# Patient Record
Sex: Female | Born: 1950 | Race: White | Hispanic: No | Smoking: Never smoker
Health system: Southern US, Community
[De-identification: ages and names within clinical notes are randomized; demographics above are authoritative.]

## PROBLEM LIST (undated history)

## (undated) DIAGNOSIS — Z860100 Personal history of colon polyps, unspecified: Secondary | ICD-10-CM

## (undated) DIAGNOSIS — F411 Generalized anxiety disorder: Secondary | ICD-10-CM

## (undated) DIAGNOSIS — E079 Disorder of thyroid, unspecified: Secondary | ICD-10-CM

## (undated) DIAGNOSIS — I10 Essential (primary) hypertension: Secondary | ICD-10-CM

## (undated) DIAGNOSIS — R011 Cardiac murmur, unspecified: Secondary | ICD-10-CM

## (undated) DIAGNOSIS — F32A Depression, unspecified: Secondary | ICD-10-CM

## (undated) HISTORY — PX: CHOLECYSTECTOMY: SHX55

---

## 2013-02-13 DIAGNOSIS — F418 Other specified anxiety disorders: Secondary | ICD-10-CM | POA: Insufficient documentation

## 2020-02-20 DIAGNOSIS — I10 Essential (primary) hypertension: Secondary | ICD-10-CM | POA: Insufficient documentation

## 2020-08-17 DIAGNOSIS — R011 Cardiac murmur, unspecified: Secondary | ICD-10-CM | POA: Insufficient documentation

## 2020-08-17 DIAGNOSIS — K589 Irritable bowel syndrome without diarrhea: Secondary | ICD-10-CM | POA: Insufficient documentation

## 2021-01-28 ENCOUNTER — Emergency Department (INDEPENDENT_AMBULATORY_CARE_PROVIDER_SITE_OTHER): Payer: Medicare PPO

## 2021-01-28 ENCOUNTER — Emergency Department
Admission: EM | Admit: 2021-01-28 | Discharge: 2021-01-28 | Disposition: A | Discharge status: Home / Self Care | Payer: Medicare PPO | Source: Home / Self Care

## 2021-01-28 ENCOUNTER — Other Ambulatory Visit: Payer: Self-pay

## 2021-01-28 ENCOUNTER — Encounter: Payer: Self-pay | Admitting: Emergency Medicine

## 2021-01-28 DIAGNOSIS — R509 Fever, unspecified: Secondary | ICD-10-CM | POA: Diagnosis not present

## 2021-01-28 DIAGNOSIS — R059 Cough, unspecified: Secondary | ICD-10-CM

## 2021-01-28 DIAGNOSIS — J209 Acute bronchitis, unspecified: Secondary | ICD-10-CM | POA: Diagnosis not present

## 2021-01-28 HISTORY — DX: Cardiac murmur, unspecified: R01.1

## 2021-01-28 HISTORY — DX: Personal history of colon polyps, unspecified: Z86.0100

## 2021-01-28 HISTORY — DX: Generalized anxiety disorder: F41.1

## 2021-01-28 HISTORY — DX: Disorder of thyroid, unspecified: E07.9

## 2021-01-28 HISTORY — DX: Essential (primary) hypertension: I10

## 2021-01-28 HISTORY — DX: Personal history of colonic polyps: Z86.010

## 2021-01-28 HISTORY — DX: Depression, unspecified: F32.A

## 2021-01-28 MED ORDER — BENZONATATE 100 MG PO CAPS: 100.0000 mg | ORAL_CAPSULE | Freq: Three times a day (TID) | ORAL | 0 refills | Status: DC

## 2021-01-28 NOTE — ED Triage Notes (Signed)
Cough & low grade fever since last Saturday after eating at a restaurant Ibuprofen for fever - Tmax 100 No OTC  cold meds Pt concerned about cough   COVID vaccine - no booster  Home test negative on Wed

## 2021-01-28 NOTE — Discharge Instructions (Addendum)
No pneumonia on x-ray. Take cough medication as prescribed. Can also continue OTC medicine. Rest, and keep hydrated. Follow-up with PCP if minimal improvement in a week. Go to the ER if develop difficulty breathing.

## 2021-01-28 NOTE — ED Provider Notes (Signed)
[facility]    CSN: [id number] Arrival date & time: 01/28/21  1033      History   Chief Complaint Chief Complaint  Patient presents with  . Cough    HPI Brandy Huffman is a 70 y.o. female.   Patient presents with cough, low grade fever up to 100F, and feeling unwell since Saturday evening. She reports she went out to eat and it was very cold and when she got home she started coughing. The cough has continued since then, worse at night. She states she feels like she needs to cough something up but can't. Her throat has been bothering her from all the coughing and she has had some mild nasal congestion. The patient denies difficulty breathing, chest pain, or history of asthma or lung problems such as COPD. She has tried ibuprofen with some improvement in her temperature. She took a home COVID test Wednesday that was negative. The patient denies known sick contacts.  The history is provided by the patient.  Cough Cough characteristics:  Non-productive Associated symptoms: fever and sore throat   Associated symptoms: no chest pain, no headaches, no myalgias, no rash, no rhinorrhea, no shortness of breath and no wheezing    Past Medical History:  Diagnosis Date  . Depression   . GAD (generalized anxiety disorder)   . Heart murmur   . History of colon polyps   . Hypertension   . Thyroid disease     Patient Active Problem List   Diagnosis Date Noted  . Heart murmur 08/17/2020  . IBS (irritable bowel syndrome) 08/17/2020  . Essential hypertension 02/20/2020  . Depression with anxiety 02/13/2013    Past Surgical History:  Procedure Laterality Date  . CHOLECYSTECTOMY      OB History   No obstetric history on file.      Home Medications    Prior to Admission medications   Medication Sig Start Date End Date Taking? Authorizing Provider  alendronate (FOSAMAX) 70 MG tablet Take 1 tablet in the morning weekly with a full glass of water on empty stomach 03/12/20  Yes  [provider]  atorvastatin (LIPITOR) 10 MG tablet Take 1 tablet by mouth daily. 09/01/13  Yes [provider]  benzonatate (TESSALON) 100 MG capsule Take 1 capsule (100 mg total) by mouth every 8 (eight) hours. 01/28/21  Yes Kayleena Eke L, PA  buPROPion (WELLBUTRIN XL) 150 MG 24 hr tablet Take by mouth. 01/26/17  Yes [provider]  losartan (COZAAR) 25 MG tablet TAKE 1/2 TABLET (12.5MG ) BY MOUTH DAILY 05/25/20  Yes [provider]  brimonidine (ALPHAGAN) 0.2 % ophthalmic solution 1 drop 2 (two) times daily. 08/09/20   [provider]  Calcium Carbonate-Vitamin D 600-200 MG-UNIT TABS Take 1 tablet by mouth daily.    [provider]  dorzolamide-timolol (COSOPT) 22.3-6.8 MG/ML ophthalmic solution Place 1 drop into both eyes 2 times daily.    [provider]  escitalopram (LEXAPRO) 10 MG tablet Take by mouth.    [provider]  Multiple Vitamin (MULTI-VITAMIN) tablet Take 1 tablet by mouth daily.    [provider]  Omega-3 Fatty Acids (FISH OIL) 1000 MG CAPS 1 capsule daily after lunch.    [provider]  vitamin B-12 (CYANOCOBALAMIN) 250 MCG tablet Take by mouth.    [provider]    Family History Family History  Problem Relation Age of Onset  . Dementia Mother   . Hypertension Father   . Cancer Father   .  Mesothelioma Father     Social History Social History   Tobacco Use  . Smoking status: Never    Passive exposure: Never  . Smokeless tobacco: Never  Vaping Use  . Vaping Use: Never used  Substance Use Topics  . Alcohol use: Not Currently  . Drug use: Never     Allergies   Risedronate   Review of Systems Review of Systems  Constitutional:  Positive for fatigue and fever.  HENT:  Positive for congestion and sore throat. Negative for rhinorrhea.   Respiratory:  Positive for cough. Negative for chest tightness, shortness of breath and wheezing.   Cardiovascular:   Negative for chest pain.  Gastrointestinal:  Negative for diarrhea, nausea and vomiting.  Musculoskeletal:  Negative for myalgias.  Skin:  Negative for rash.  Neurological:  Negative for dizziness, weakness and headaches.    Physical Exam Triage Vital Signs ED Triage Vitals  Enc Vitals Group     BP 01/28/21 1051 135/85     Pulse Rate 01/28/21 1051 (!) 104     Resp 01/28/21 1051 18     Temp 01/28/21 1051 99.5 F (37.5 C)     Temp Source 01/28/21 1051 Oral     SpO2 01/28/21 1051 98 %     Weight 01/28/21 1052 139 lb (63 kg)     Height 01/28/21 1052 5\' 7"  (1.702 m)     Head Circumference --      Peak Flow --      Pain Score 01/28/21 1052 0     Pain Loc --      Pain Edu? --      Excl. in GC? --    No data found.  Updated Vital Signs BP 135/85 (BP Location: Left Arm)   Pulse (!) 104   Temp 99.5 F (37.5 C) (Oral)   Resp 18   Ht 5\' 7"  (1.702 m)   Wt 139 lb (63 kg)   SpO2 98%   BMI 21.77 kg/m   Visual Acuity Right Eye Distance:   Left Eye Distance:   Bilateral Distance:    Right Eye Near:   Left Eye Near:    Bilateral Near:     Physical Exam Vitals and nursing note reviewed.  Constitutional:      General: She is not in acute distress. HENT:     Head: Normocephalic.     Nose: Congestion present.     Mouth/Throat:     Mouth: Mucous membranes are moist.     Pharynx: Oropharynx is clear. No oropharyngeal exudate or posterior oropharyngeal erythema.  Eyes:     Conjunctiva/sclera: Conjunctivae normal.     Pupils: Pupils are equal, round, and reactive to light.  Cardiovascular:     Rate and Rhythm: Normal rate and regular rhythm.     Heart sounds: Normal heart sounds.  Pulmonary:     Effort: Pulmonary effort is normal. No respiratory distress.     Breath sounds: No wheezing, rhonchi or rales.  Musculoskeletal:     Cervical back: Normal range of motion.  Lymphadenopathy:     Cervical: No cervical adenopathy.  Skin:    General: Skin is warm.     Findings: No  rash.  Neurological:     Mental Status: She is alert.  Psychiatric:        Mood and Affect: Mood normal.     UC Treatments / Results  Labs (all labs ordered are listed, but only abnormal results are displayed) Labs Reviewed -  No data to display  EKG   Radiology DG Chest 2 View  Result Date: 01/28/2021 CLINICAL DATA:  Cough, fever x6d. Additional history provided: Cough, low-grade fever, symptoms for 1 week. Recent negative COVID test at home. EXAM: CHEST - 2 VIEW COMPARISON:  Chest CT 09/19/2017 (images available, report unavailable). Chest radiographs 09/17/2017. FINDINGS: Heart size within normal limits. Aortic atherosclerosis. Biapical pleuroparenchymal scarring. No appreciable airspace consolidation. No evidence of pleural effusion or pneumothorax. No acute bony abnormality identified. Thoracic dextrocurvature. Surgical clips within the right upper quadrant of the abdomen. IMPRESSION: No evidence of acute cardiopulmonary abnormality. Aortic Atherosclerosis (ICD10-I70.0). Electronically Signed   By: Jackey Loge DO   On: 01/28/2021 11:45    Procedures Procedures (including critical care time)  Medications Ordered in UC Medications - No data to display  Initial Impression / Assessment and Plan / UC Course  I have reviewed the triage vital signs and the nursing notes.  Pertinent labs & imaging results that were available during my care of the patient were reviewed by me and considered in my medical decision making (see chart for details).     Neg CXR. Consistent with viral bronchitis. Sx tx w Tessalon, ibuprofen. Reassurance and discussed ER precautions.   E/M: 1 acute illness with systemic symptoms, 1 data (CXR), moderate risk due to prescription management  Final Clinical Impressions(s) / UC Diagnoses   Final diagnoses:  Acute bronchitis, unspecified organism     Discharge Instructions      No pneumonia on x-ray. Take cough medication as prescribed. Can also  continue OTC medicine. Rest, and keep hydrated. Follow-up with PCP if minimal improvement in a week. Go to the ER if develop difficulty breathing.      ED Prescriptions     Medication Sig Dispense Auth. Provider   benzonatate (TESSALON) 100 MG capsule Take 1 capsule (100 mg total) by mouth every 8 (eight) hours. 21 capsule Vallery Sa, Zane Pellecchia L, PA      PDMP not reviewed this encounter.   Estanislado Pandy, [place] 01/28/21 1159

## 2021-07-04 ENCOUNTER — Emergency Department
Admission: EM | Admit: 2021-07-04 | Discharge: 2021-07-04 | Disposition: A | Discharge status: Home / Self Care | Payer: Medicare PPO | Source: Home / Self Care

## 2021-07-04 ENCOUNTER — Other Ambulatory Visit: Payer: Self-pay

## 2021-07-04 DIAGNOSIS — R059 Cough, unspecified: Secondary | ICD-10-CM

## 2021-07-04 MED ORDER — BENZONATATE 200 MG PO CAPS: 200.0000 mg | ORAL_CAPSULE | Freq: Three times a day (TID) | ORAL | 0 refills | Status: AC | PRN

## 2021-07-04 MED ORDER — AZITHROMYCIN 250 MG PO TABS: 250.0000 mg | ORAL_TABLET | Freq: Every day | ORAL | 0 refills | Status: AC

## 2021-07-04 NOTE — ED Provider Notes (Signed)
[facility]    CSN: [id number] Arrival date & time: 07/04/21  1128      History   Chief Complaint Chief Complaint  Patient presents with   Nasal Congestion   Cough    HPI Brandy Huffman is a 70 y.o. female.   HPI 70 year old female presents with cough, nasal drainage and loss of voice for 3 days.  PMH significant for HTN, heart murmur, and thyroid disease.   Past Medical History:  Diagnosis Date   Depression    GAD (generalized anxiety disorder)    Heart murmur    History of colon polyps    Hypertension    Thyroid disease     Patient Active Problem List   Diagnosis Date Noted   Heart murmur 08/17/2020   IBS (irritable bowel syndrome) 08/17/2020   Essential hypertension 02/20/2020   Depression with anxiety 02/13/2013    Past Surgical History:  Procedure Laterality Date   CHOLECYSTECTOMY      OB History   No obstetric history on file.      Home Medications    Prior to Admission medications   Medication Sig Start Date End Date Taking? Authorizing Provider  azithromycin (ZITHROMAX) 250 MG tablet Take 1 tablet (250 mg total) by mouth daily. Take first 2 tablets together, then 1 every day until finished. 07/04/21  Yes Trevor Iha, FNP  benzonatate (TESSALON) 200 MG capsule Take 1 capsule (200 mg total) by mouth 3 (three) times daily as needed for up to 7 days for cough. 07/04/21 07/11/21 Yes Trevor Iha, FNP  alendronate (FOSAMAX) 70 MG tablet Take 1 tablet in the morning weekly with a full glass of water on empty stomach 03/12/20   [provider]  atorvastatin (LIPITOR) 10 MG tablet Take 1 tablet by mouth daily. 09/01/13   [provider]  brimonidine (ALPHAGAN) 0.2 % ophthalmic solution 1 drop 2 (two) times daily. 08/09/20   [provider]  buPROPion (WELLBUTRIN XL) 150 MG 24 hr tablet Take by mouth. 01/26/17   [provider]  Calcium Carbonate-Vitamin D 600-200 MG-UNIT TABS Take 1 tablet by mouth daily.     [provider]  dorzolamide-timolol (COSOPT) 22.3-6.8 MG/ML ophthalmic solution Place 1 drop into both eyes 2 times daily.    [provider]  escitalopram (LEXAPRO) 10 MG tablet Take by mouth.    [provider]  losartan (COZAAR) 25 MG tablet TAKE 1/2 TABLET (12.5MG ) BY MOUTH DAILY 05/25/20   [provider]  Multiple Vitamin (MULTI-VITAMIN) tablet Take 1 tablet by mouth daily.    [provider]  Omega-3 Fatty Acids (FISH OIL) 1000 MG CAPS 1 capsule daily after lunch.    [provider]  vitamin B-12 (CYANOCOBALAMIN) 250 MCG tablet Take by mouth.    [provider]    Family History Family History  Problem Relation Age of Onset   Dementia Mother    Hypertension Father    Cancer Father    Mesothelioma Father     Social History Social History   Tobacco Use   Smoking status: Never    Passive exposure: Never   Smokeless tobacco: Never  Vaping Use   Vaping Use: Never used  Substance Use Topics   Alcohol use: Not Currently   Drug use: Never     Allergies   Risedronate   Review of Systems Review of Systems  HENT:  Positive for congestion and voice change.   Respiratory:  Positive for cough.   All other  systems reviewed and are negative.   Physical Exam Triage Vital Signs ED Triage Vitals  Enc Vitals Group     BP 07/04/21 1154 (!) 153/95     Pulse Rate 07/04/21 1154 (!) 109     Resp 07/04/21 1154 14     Temp 07/04/21 1154 99.2 F (37.3 C)     Temp Source 07/04/21 1154 Oral     SpO2 07/04/21 1154 99 %     Weight --      Height --      Head Circumference --      Peak Flow --      Pain Score 07/04/21 1156 0     Pain Loc --      Pain Edu? --      Excl. in GC? --    No data found.  Updated Vital Signs BP (!) 153/95 (BP Location: Left Arm)    Pulse (!) 109    Temp 99.2 F (37.3 C) (Oral)    Resp 14    SpO2 99%   Physical Exam Vitals and nursing note reviewed.  Constitutional:      Appearance:  Normal appearance. She is normal weight.  HENT:     Head: Normocephalic and atraumatic.     Right Ear: Tympanic membrane, ear canal and external ear normal.     Left Ear: Tympanic membrane, ear canal and external ear normal.     Mouth/Throat:     Mouth: Mucous membranes are moist.     Pharynx: Oropharynx is clear.  Eyes:     Extraocular Movements: Extraocular movements intact.     Conjunctiva/sclera: Conjunctivae normal.     Pupils: Pupils are equal, round, and reactive to light.  Cardiovascular:     Rate and Rhythm: Normal rate and regular rhythm.     Pulses: Normal pulses.     Heart sounds: Normal heart sounds.  Pulmonary:     Effort: Pulmonary effort is normal.     Breath sounds: Normal breath sounds. No wheezing, rhonchi or rales.  Musculoskeletal:     Cervical back: Normal range of motion and neck supple.  Skin:    General: Skin is warm and dry.  Neurological:     General: No focal deficit present.     Mental Status: She is alert and oriented to person, place, and time.     UC Treatments / Results  Labs (all labs ordered are listed, but only abnormal results are displayed) Labs Reviewed  COVID-19, FLU A+B NAA    EKG   Radiology No results found.  Procedures Procedures (including critical care time)  Medications Ordered in UC Medications - No data to display  Initial Impression / Assessment and Plan / UC Course  I have reviewed the triage vital signs and the nursing notes.  Pertinent labs & imaging results that were available during my care of the patient were reviewed by me and considered in my medical decision making (see chart for details).     MDM: 1. Cough-COVID-19 flu A/B ordered, Rx Tessalon Perles and Zithromax. Advised patient we will follow-up with COVID-19 flu A/B results once returned.  Advised patient may use Tessalon Perles daily or as needed for cough.  Advised patient if cough and/or sore throat worsens may start Zithromax on either Thursday,  07/07/2021 or Friday, 07/08/2021.  Advised once starting antibiotic please take to completion.  Encouraged patient to increase daily water intake while taking these medications.  Patient discharged home, hemodynamically stable Final Clinical  Impressions(s) / UC Diagnoses   Final diagnoses:  Cough, unspecified type     Discharge Instructions      Advised patient we will follow-up with COVID-19 flu A/B results once returned.  Advised patient may use Tessalon Perles daily or as needed for cough.  Advised patient if cough and/or sore throat worsens may start Zithromax on either Thursday, 07/07/2021 or Friday, 07/08/2021.  Advised once starting antibiotic please take to completion.  Encouraged patient to increase daily water intake while taking these medications.     ED Prescriptions     Medication Sig Dispense Auth. Provider   benzonatate (TESSALON) 200 MG capsule Take 1 capsule (200 mg total) by mouth 3 (three) times daily as needed for up to 7 days for cough. 40 capsule Trevor Iha, FNP   azithromycin (ZITHROMAX) 250 MG tablet Take 1 tablet (250 mg total) by mouth daily. Take first 2 tablets together, then 1 every day until finished. 6 tablet Trevor Iha, FNP      PDMP not reviewed this encounter.   Trevor Iha, FNP 07/04/21 1429

## 2021-07-04 NOTE — Discharge Instructions (Addendum)
Advised patient we will follow-up with COVID-19 flu A/B results once returned.  Advised patient may use Tessalon Perles daily or as needed for cough.  Advised patient if cough and/or sore throat worsens may start Zithromax on either Thursday, 07/07/2021 or Friday, 07/08/2021.  Advised once starting antibiotic please take to completion.  Encouraged patient to increase daily water intake while taking these medications.

## 2021-07-04 NOTE — ED Triage Notes (Signed)
Pt presents with cough, nasal drainage, loss of voice that began Friday.

## 2021-07-05 LAB — COVID-19, FLU A+B NAA
Influenza A, NAA: NOT DETECTED
Influenza B, NAA: NOT DETECTED
SARS-CoV-2, NAA: NOT DETECTED

## 2021-12-29 ENCOUNTER — Emergency Department
Admission: EM | Admit: 2021-12-29 | Discharge: 2021-12-29 | Disposition: A | Discharge status: Home / Self Care | Payer: Medicare PPO | Source: Home / Self Care

## 2021-12-29 ENCOUNTER — Encounter: Payer: Self-pay | Admitting: Emergency Medicine

## 2021-12-29 DIAGNOSIS — L237 Allergic contact dermatitis due to plants, except food: Secondary | ICD-10-CM | POA: Diagnosis not present

## 2021-12-29 MED ORDER — PREDNISONE 10 MG (21) PO TBPK: ORAL_TABLET | Freq: Every day | ORAL | 0 refills | Status: AC

## 2021-12-29 MED ORDER — METHYLPREDNISOLONE ACETATE 80 MG/ML IJ SUSP
80.0000 mg | Freq: Once | INTRAMUSCULAR | Status: AC
  Administered 2021-12-29: 80 mg via INTRAMUSCULAR

## 2021-12-29 NOTE — ED Provider Notes (Signed)
[facility]    CSN: [id number] Arrival date & time: 12/29/21  1406      History   Chief Complaint Chief Complaint  Patient presents with   Rash    HPI Brandy Huffman is a 71 y.o. female.   HPI 71 year old female presents with possible poison ivy dermatitis over her arms and chest from working in the yard for the time past 3 days.  PMH significant for HTN and thyroid disease.  Past Medical History:  Diagnosis Date   Depression    GAD (generalized anxiety disorder)    Heart murmur    History of colon polyps    Hypertension    Thyroid disease     Patient Active Problem List   Diagnosis Date Noted   Heart murmur 08/17/2020   IBS (irritable bowel syndrome) 08/17/2020   Essential hypertension 02/20/2020   Depression with anxiety 02/13/2013    Past Surgical History:  Procedure Laterality Date   CHOLECYSTECTOMY      OB History   No obstetric history on file.      Home Medications    Prior to Admission medications   Medication Sig Start Date End Date Taking? Authorizing Provider  atorvastatin (LIPITOR) 10 MG tablet Take 1 tablet by mouth daily. 09/01/13  Yes [provider]  azithromycin (ZITHROMAX) 250 MG tablet Take 1 tablet (250 mg total) by mouth daily. Take first 2 tablets together, then 1 every day until finished. 07/04/21  Yes Trevor Iha, FNP  brimonidine (ALPHAGAN) 0.2 % ophthalmic solution 1 drop 2 (two) times daily. 08/09/20  Yes [provider]  buPROPion (WELLBUTRIN XL) 150 MG 24 hr tablet Take by mouth. 01/26/17  Yes [provider]  Calcium Carbonate-Vitamin D 600-200 MG-UNIT TABS Take 1 tablet by mouth daily.   Yes [provider]  dorzolamide-timolol (COSOPT) 22.3-6.8 MG/ML ophthalmic solution Place 1 drop into both eyes 2 times daily.   Yes [provider]  escitalopram (LEXAPRO) 10 MG tablet Take by mouth.   Yes [provider]  losartan (COZAAR) 25 MG tablet TAKE 1/2 TABLET (12.5MG ) BY  MOUTH DAILY 05/25/20  Yes [provider]  Multiple Vitamin (MULTI-VITAMIN) tablet Take 1 tablet by mouth daily.   Yes [provider]  Omega-3 Fatty Acids (FISH OIL) 1000 MG CAPS 1 capsule daily after lunch.   Yes [provider]  predniSONE (STERAPRED UNI-PAK 21 TAB) 10 MG (21) TBPK tablet Take by mouth daily. Take 6 tabs by mouth daily  for 2 days, then 5 tabs for 2 days, then 4 tabs for 2 days, then 3 tabs for 2 days, 2 tabs for 2 days, then 1 tab by mouth daily for 2 days 12/29/21  Yes Trevor Iha, FNP  vitamin B-12 (CYANOCOBALAMIN) 250 MCG tablet Take by mouth.   Yes [provider]  alendronate (FOSAMAX) 70 MG tablet Take 1 tablet in the morning weekly with a full glass of water on empty stomach 03/12/20   [provider]    Family History Family History  Problem Relation Age of Onset   Dementia Mother    Hypertension Father    Cancer Father    Mesothelioma Father     Social History Social History   Tobacco Use   Smoking status: Never    Passive exposure: Never   Smokeless tobacco: Never  Vaping Use   Vaping Use: Never used  Substance Use Topics   Alcohol use: Not Currently   Drug use: Never  Allergies   Risedronate   Review of Systems Review of Systems  Skin:  Positive for rash.  All other systems reviewed and are negative.    Physical Exam Triage Vital Signs ED Triage Vitals  Enc Vitals Group     BP 12/29/21 1425 135/87     Pulse Rate 12/29/21 1425 73     Resp 12/29/21 1425 18     Temp 12/29/21 1425 98.6 F (37 C)     Temp Source 12/29/21 1425 Oral     SpO2 12/29/21 1425 (!) 73 %     Weight 12/29/21 1426 131 lb (59.4 kg)     Height 12/29/21 1426 5\' 7"  (1.702 m)     Head Circumference --      Peak Flow --      Pain Score 12/29/21 1426 0     Pain Loc --      Pain Edu? --      Excl. in GC? --    No data found.  Updated Vital Signs BP 135/87 (BP Location: Right Arm)   Pulse 73   Temp 98.6 F (37  C) (Oral)   Resp 18   Ht 5\' 7"  (1.702 m)   Wt 131 lb (59.4 kg)   SpO2 (!) 73%   BMI 20.52 kg/m      Physical Exam Vitals and nursing note reviewed.  Constitutional:      General: She is not in acute distress.    Appearance: Normal appearance. She is normal weight. She is not ill-appearing.  HENT:     Head: Normocephalic and atraumatic.     Mouth/Throat:     Mouth: Mucous membranes are moist.     Pharynx: Oropharynx is clear.  Eyes:     Extraocular Movements: Extraocular movements intact.     Conjunctiva/sclera: Conjunctivae normal.     Pupils: Pupils are equal, round, and reactive to light.  Cardiovascular:     Rate and Rhythm: Normal rate and regular rhythm.     Pulses: Normal pulses.     Heart sounds: Normal heart sounds.  Pulmonary:     Effort: Pulmonary effort is normal.     Breath sounds: Normal breath sounds. No wheezing, rhonchi or rales.  Musculoskeletal:     Cervical back: Normal range of motion and neck supple.  Skin:    General: Skin is warm and dry.     Comments: Right anterior shoulder, inferior neck, bilateral lower arms: Pruritic erythematous maculopapular eruption with grouped linear vesicles noted  Neurological:     General: No focal deficit present.     Mental Status: She is alert and oriented to person, place, and time.      UC Treatments / Results  Labs (all labs ordered are listed, but only abnormal results are displayed) Labs Reviewed - No data to display  EKG   Radiology No results found.  Procedures Procedures (including critical care time)  Medications Ordered in UC Medications  methylPREDNISolone acetate (DEPO-MEDROL) injection 80 mg (has no administration in time range)    Initial Impression / Assessment and Plan / UC Course  I have reviewed the triage vital signs and the nursing notes.  Pertinent labs & imaging results that were available during my care of the patient were reviewed by me and considered in my medical  decision making (see chart for details).     MDM: 1.  Poison ivy dermatitis-IM Depo-Medrol 80 mg given once in clinic prior to discharge, Rx'd Sterapred Unipak. Instructed patient  to take medication as directed with food to completion.  Encouraged patient to increase daily water intake while taking this medication.  Encouraged patient to change bed linens for the next 3 days to avoid recontamination.  Advised patient if symptoms worsen and/or unresolved please follow-up with PCP or here for further evaluation.  Patient discharged home, hemodynamically stable. Final Clinical Impressions(s) / UC Diagnoses   Final diagnoses:  Poison ivy dermatitis     Discharge Instructions      Instructed patient to take medication as directed with food to completion.  Encouraged patient to increase daily water intake while taking this medication.  Encouraged patient to change bed linens for the next 3 days to avoid recontamination.  Advised patient if symptoms worsen and/or unresolved please follow-up with PCP or here for further evaluation.     ED Prescriptions     Medication Sig Dispense Auth. Provider   predniSONE (STERAPRED UNI-PAK 21 TAB) 10 MG (21) TBPK tablet Take by mouth daily. Take 6 tabs by mouth daily  for 2 days, then 5 tabs for 2 days, then 4 tabs for 2 days, then 3 tabs for 2 days, 2 tabs for 2 days, then 1 tab by mouth daily for 2 days 42 tablet Trevor Iha, FNP      PDMP not reviewed this encounter.   Trevor Iha, FNP 12/29/21 1520

## 2021-12-29 NOTE — ED Triage Notes (Signed)
Patient states that she believes she has poison ivy on her arms and chest from working out in the yards x 3 days.  Patient has applied the alcohol to the areas, itching and redness.

## 2021-12-29 NOTE — Discharge Instructions (Addendum)
Instructed patient to take medication as directed with food to completion.  Encouraged patient to increase daily water intake while taking this medication.  Encouraged patient to change bed linens for the next 3 days to avoid recontamination.  Advised patient if symptoms worsen and/or unresolved please follow-up with PCP or here for further evaluation.

## 2022-05-04 ENCOUNTER — Ambulatory Visit
Admission: EM | Admit: 2022-05-04 | Discharge: 2022-05-04 | Disposition: A | Discharge status: Home / Self Care | Payer: Medicare PPO

## 2022-05-04 DIAGNOSIS — H6123 Impacted cerumen, bilateral: Secondary | ICD-10-CM | POA: Diagnosis not present

## 2022-05-04 NOTE — ED Triage Notes (Signed)
Pt c/o bilateral hearing loss x 1 year. Worsening in last week. PCP couldn't get her in until December. No hx of ear wax impactions. Tried to flush with peroxide last night with no relief.

## 2022-05-04 NOTE — ED Provider Notes (Signed)
[facility]    CSN: [id number] Arrival date & time: 05/04/22  0948      History   Chief Complaint Chief Complaint  Patient presents with   Ear Fullness    Bilateral    HPI Brandy Huffman is a 71 y.o. female.   HPI 71 year old female presents with bilateral hearing loss for the past year.  Patient reports hearing loss is worsened over the past week.  Reports that PT PCP could not get her in until December.  Patient denies history of earwax impactions.  Patient reports tried to flush with peroxide last night with no relief.  PMH significant for thyroid disease, HTN, and GAD.  Past Medical History:  Diagnosis Date   Depression    GAD (generalized anxiety disorder)    Heart murmur    History of colon polyps    Hypertension    Thyroid disease     Patient Active Problem List   Diagnosis Date Noted   Heart murmur 08/17/2020   IBS (irritable bowel syndrome) 08/17/2020   Essential hypertension 02/20/2020   Depression with anxiety 02/13/2013    Past Surgical History:  Procedure Laterality Date   CHOLECYSTECTOMY      OB History   No obstetric history on file.      Home Medications    Prior to Admission medications   Medication Sig Start Date End Date Taking? Authorizing Provider  alendronate (FOSAMAX) 70 MG tablet Take 1 tablet in the morning weekly with a full glass of water on empty stomach 03/12/20   [provider]  atorvastatin (LIPITOR) 10 MG tablet Take 1 tablet by mouth daily. 09/01/13   [provider]  azithromycin (ZITHROMAX) 250 MG tablet Take 1 tablet (250 mg total) by mouth daily. Take first 2 tablets together, then 1 every day until finished. 07/04/21   Trevor Iha, FNP  brimonidine (ALPHAGAN) 0.2 % ophthalmic solution 1 drop 2 (two) times daily. 08/09/20   [provider]  buPROPion (WELLBUTRIN XL) 150 MG 24 hr tablet Take by mouth. 01/26/17   [provider]  Calcium Carbonate-Vitamin D 600-200 MG-UNIT TABS  Take 1 tablet by mouth daily.    [provider]  dorzolamide-timolol (COSOPT) 22.3-6.8 MG/ML ophthalmic solution Place 1 drop into both eyes 2 times daily.    [provider]  escitalopram (LEXAPRO) 10 MG tablet Take by mouth.    [provider]  losartan (COZAAR) 25 MG tablet TAKE 1/2 TABLET (12.5MG ) BY MOUTH DAILY 05/25/20   [provider]  Multiple Vitamin (MULTI-VITAMIN) tablet Take 1 tablet by mouth daily.    [provider]  Omega-3 Fatty Acids (FISH OIL) 1000 MG CAPS 1 capsule daily after lunch.    [provider]  predniSONE (STERAPRED UNI-PAK 21 TAB) 10 MG (21) TBPK tablet Take by mouth daily. Take 6 tabs by mouth daily  for 2 days, then 5 tabs for 2 days, then 4 tabs for 2 days, then 3 tabs for 2 days, 2 tabs for 2 days, then 1 tab by mouth daily for 2 days 12/29/21   Trevor Iha, FNP  vitamin B-12 (CYANOCOBALAMIN) 250 MCG tablet Take by mouth.    [provider]    Family History Family History  Problem Relation Age of Onset   Dementia Mother    Hypertension Father    Cancer Father    Mesothelioma Father     Social History Social History   Tobacco Use   Smoking status: Never  Passive exposure: Never   Smokeless tobacco: Never  Vaping Use   Vaping Use: Never used  Substance Use Topics   Alcohol use: Not Currently   Drug use: Never     Allergies   Risedronate   Review of Systems Review of Systems  HENT:         Bilateral hearing loss x1 year     Physical Exam Triage Vital Signs ED Triage Vitals  Enc Vitals Group     BP 05/04/22 0959 136/81     Pulse Rate 05/04/22 0959 (!) 101     Resp 05/04/22 0959 18     Temp 05/04/22 0959 98.2 F (36.8 C)     Temp Source 05/04/22 0959 Oral     SpO2 05/04/22 0959 100 %     Weight --      Height --      Head Circumference --      Peak Flow --      Pain Score 05/04/22 1000 1     Pain Loc --      Pain Edu? --      Excl. in [place]? --    No data  found.  Updated Vital Signs BP 136/81 (BP Location: Left Arm)   Pulse (!) 101   Temp 98.2 F (36.8 C) (Oral)   Resp 18   SpO2 100%    Physical Exam Vitals and nursing note reviewed.  Constitutional:      Appearance: Normal appearance. She is normal weight.  HENT:     Head: Normocephalic and atraumatic.     Ears:     Comments: EAC's occluded with cerumen unable to visualize either TM; post bilateral ear lavage EAC's clear with TM's retracted, cloudy with dull light reflex noted bilaterally    Mouth/Throat:     Mouth: Mucous membranes are moist.     Pharynx: Oropharynx is clear.  Eyes:     Extraocular Movements: Extraocular movements intact.     Conjunctiva/sclera: Conjunctivae normal.     Pupils: Pupils are equal, round, and reactive to light.  Cardiovascular:     Rate and Rhythm: Normal rate and regular rhythm.     Pulses: Normal pulses.     Heart sounds: Normal heart sounds.  Pulmonary:     Effort: Pulmonary effort is normal.     Breath sounds: Normal breath sounds. No wheezing, rhonchi or rales.  Musculoskeletal:        General: Normal range of motion.     Cervical back: Normal range of motion and neck supple.  Skin:    General: Skin is warm and dry.  Neurological:     General: No focal deficit present.     Mental Status: She is alert and oriented to person, place, and time.      UC Treatments / Results  Labs (all labs ordered are listed, but only abnormal results are displayed) Labs Reviewed - No data to display  EKG   Radiology No results found.  Procedures Procedures (including critical care time)  Medications Ordered in UC Medications - No data to display  Initial Impression / Assessment and Plan / UC Course  I have reviewed the triage vital signs and the nursing notes.  Pertinent labs & imaging results that were available during my care of the patient were reviewed by me and considered in my medical decision making (see chart for details).      MDM: 1.  Bilateral impacted cerumen-resolved with bilateral ear lavage; hearing loss  of both ears due to cerumen impaction-Advised patient bilateral ear lavage resolved occluding earwax.  Advised patient to follow-up with PCP for referral to audiology for further evaluation of continued hearing loss.  Patient discharged home, hemodynamically stable. Final Clinical Impressions(s) / UC Diagnoses   Final diagnoses:  Hearing loss of both ears due to cerumen impaction  Bilateral impacted cerumen     Discharge Instructions      Advised patient bilateral ear lavage resolved occluding earwax.  Advised patient to follow-up with PCP for referral to audiology for further evaluation of continued hearing loss.     ED Prescriptions   None    PDMP not reviewed this encounter.   Trevor Iha, FNP 05/04/22 1030

## 2022-05-04 NOTE — Discharge Instructions (Addendum)
Advised patient bilateral ear lavage resolved occluding earwax.  Advised patient to follow-up with PCP for referral to audiology for further evaluation of continued hearing loss.

## 2022-12-07 IMAGING — DX DG CHEST 2V
2 series · 2 of 2 positions shown · non-contrast
Comparison: Chest CT 09/19/2017 (images available, report
unavailable). Chest radiographs 09/17/2017.

CLINICAL DATA: Cough, fever x6d. Additional history provided:
Cough, low-grade fever, symptoms for 1 week. Recent negative COVID
test at home.

EXAM:
CHEST - 2 VIEW

[chest pa]
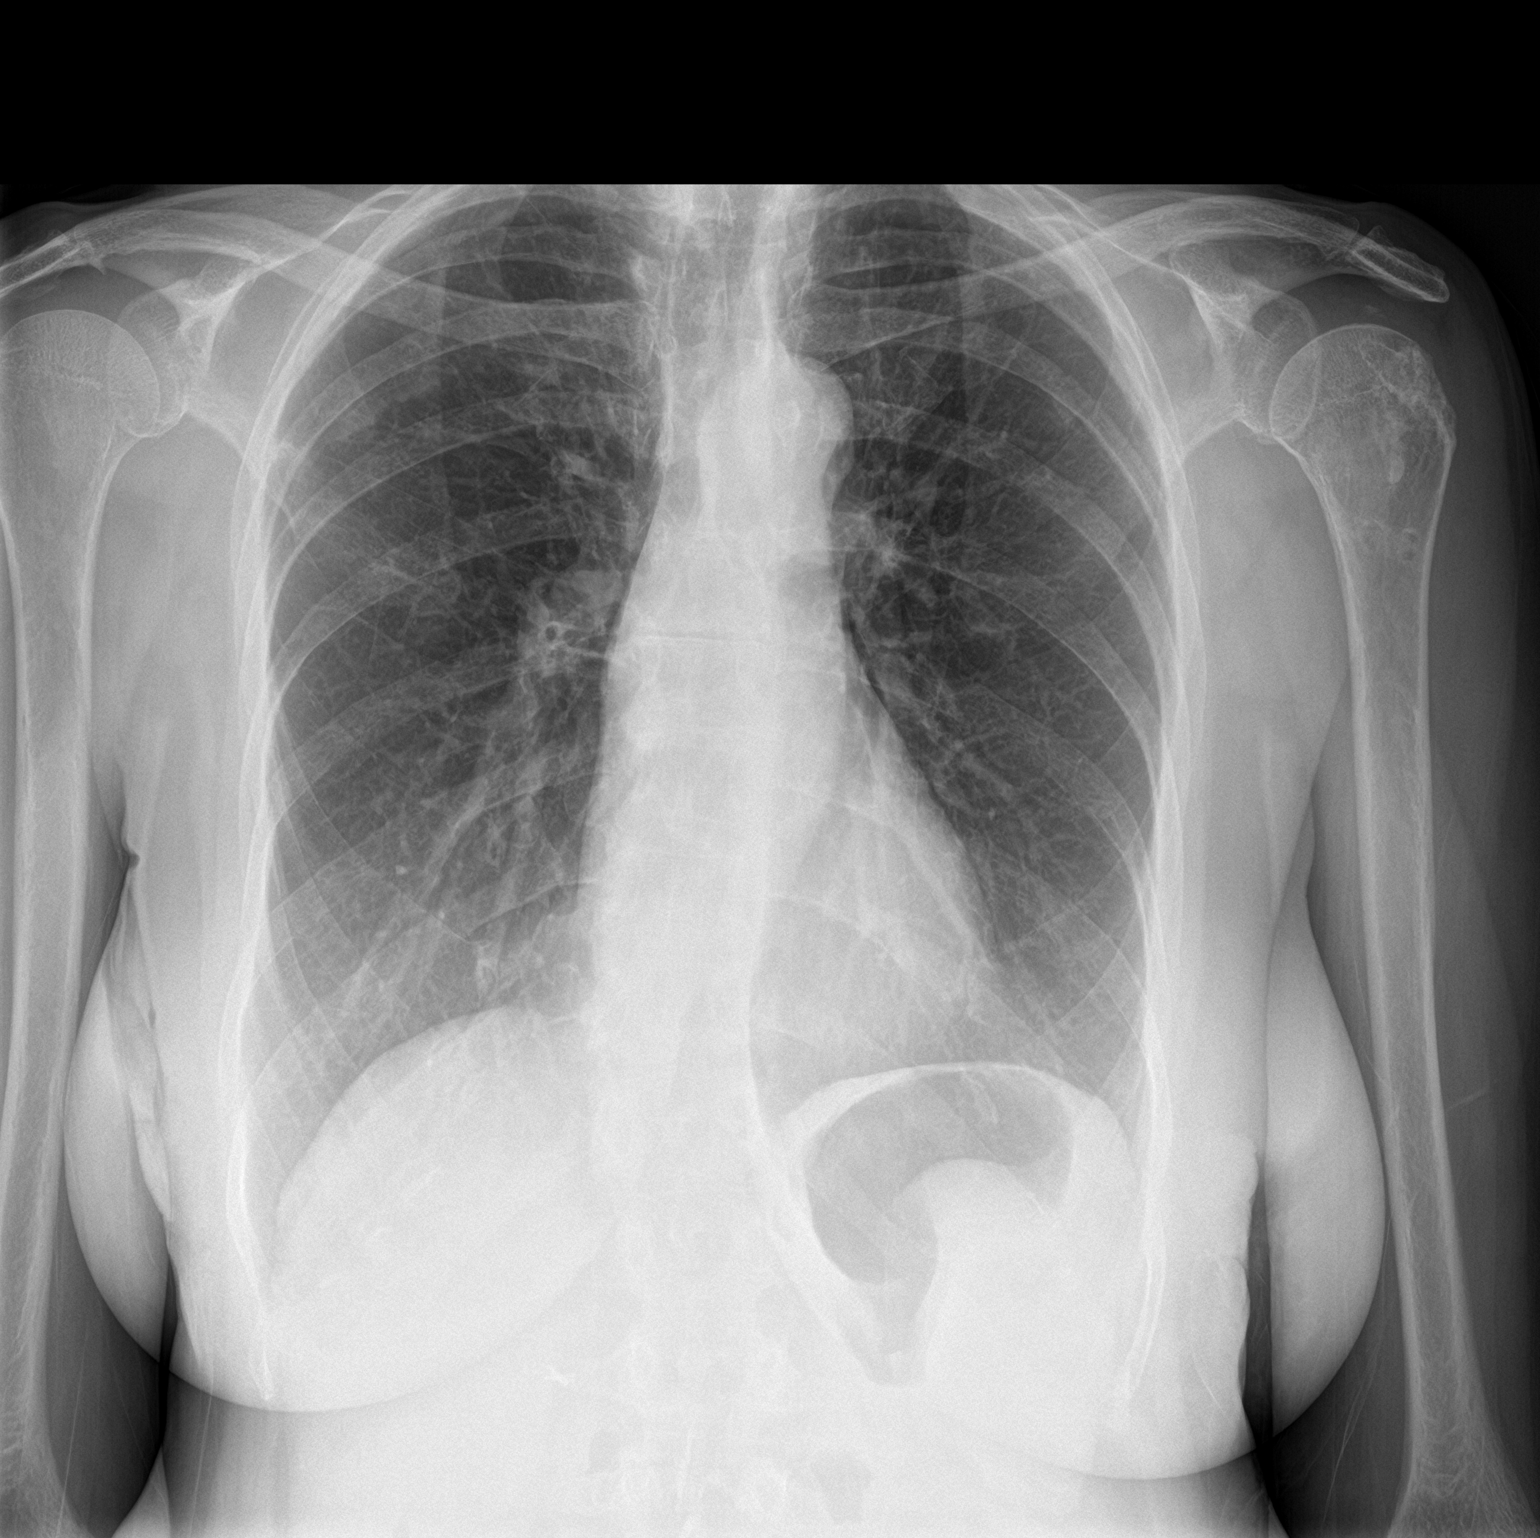

[chest lat]
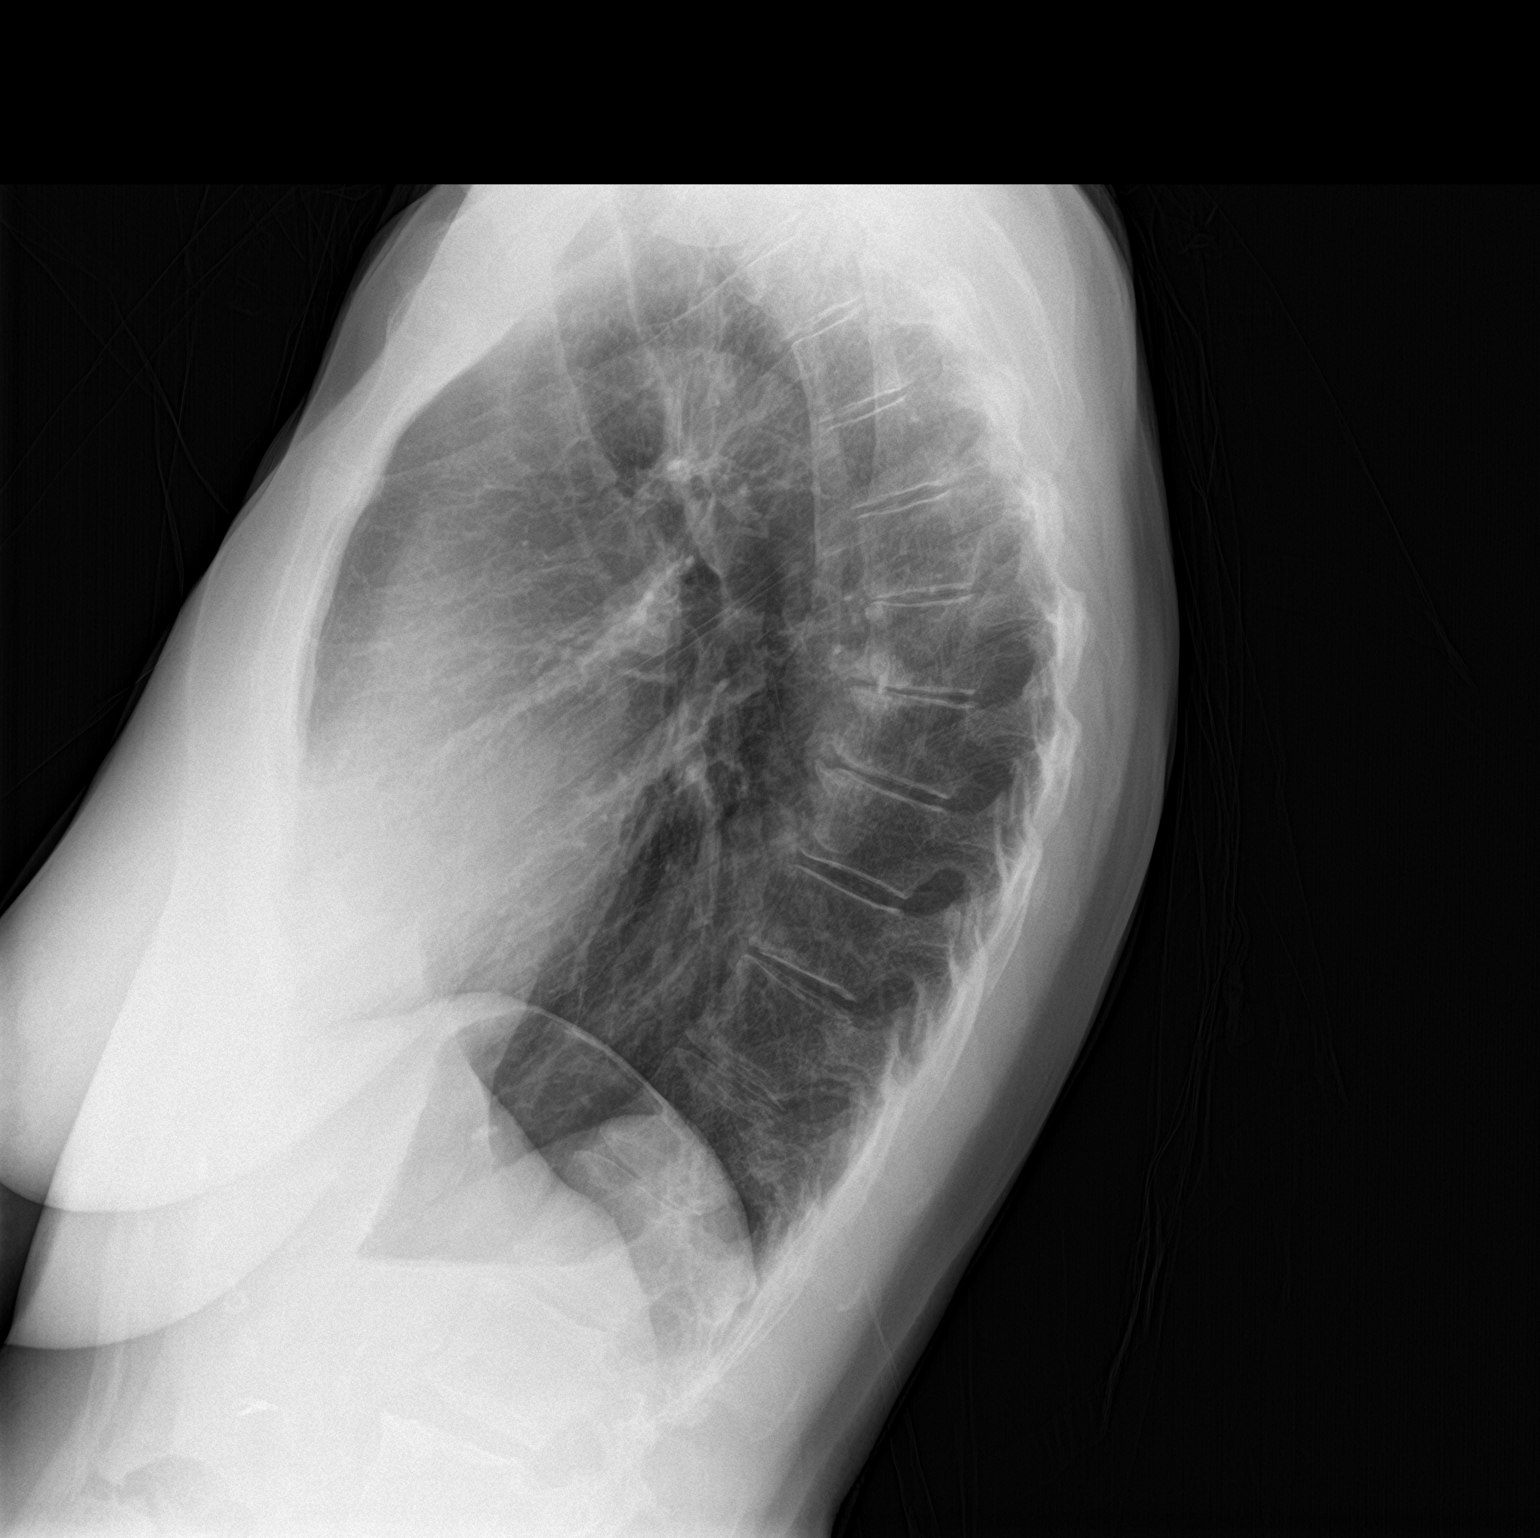

[2 of 2 positions shown; findings below may reference images not displayed]

FINDINGS: Heart size within normal limits. Aortic atherosclerosis. Biapical
pleuroparenchymal scarring. No appreciable airspace consolidation.
No evidence of pleural effusion or pneumothorax. No acute bony
abnormality identified. Thoracic dextrocurvature. Surgical clips
within the right upper quadrant of the abdomen.
IMPRESSION: No evidence of acute cardiopulmonary abnormality.

Aortic Atherosclerosis ([id number].R).

## 2023-01-30 ENCOUNTER — Ambulatory Visit (INDEPENDENT_AMBULATORY_CARE_PROVIDER_SITE_OTHER): Payer: Medicare PPO | Admitting: Psychiatry

## 2023-01-30 ENCOUNTER — Encounter (HOSPITAL_COMMUNITY): Payer: Self-pay | Admitting: Psychiatry

## 2023-01-30 VITALS — BP 122/90 | HR 105 | Ht 67.0 in | Wt 120.0 lb

## 2023-01-30 DIAGNOSIS — F418 Other specified anxiety disorders: Secondary | ICD-10-CM | POA: Diagnosis not present

## 2023-01-30 DIAGNOSIS — R413 Other amnesia: Secondary | ICD-10-CM

## 2023-01-30 DIAGNOSIS — F331 Major depressive disorder, recurrent, moderate: Secondary | ICD-10-CM | POA: Diagnosis not present

## 2023-01-30 DIAGNOSIS — F411 Generalized anxiety disorder: Secondary | ICD-10-CM | POA: Diagnosis not present

## 2023-01-30 MED ORDER — ESCITALOPRAM OXALATE 5 MG PO TABS: 5.0000 mg | ORAL_TABLET | Freq: Every day | ORAL | 0 refills | Status: DC

## 2023-01-30 NOTE — Progress Notes (Signed)
Psychiatric Initial Adult Assessment   Patient Identification: Brandy Huffman MRN:  [id number] Date of Evaluation:  01/30/2023 Referral Source: primary care Chief Complaint:   Chief Complaint  Patient presents with   New Patient (Initial Visit)   Establish Care   Anxiety   Visit Diagnosis:    ICD-10-CM   1. Depression with anxiety  F41.8     2. MDD (major depressive disorder), recurrent episode, moderate (HCC)  F33.1     3. GAD (generalized anxiety disorder)  F41.1     4. Memory changes  R41.3       History of Present Illness:  72 years old white widow, referred by PCP to eval for depression and manage. She is retired. Lives by herself, does like living by herself but noticing increase depression, anxiety  Patient had recent cataract surgery and went well but feel exacerbation of anxiety, depression and forgetfullness. She had at times screamed and yelled when she forgets things or where she has placed things. Gets anxious, worriful and makes her upset.  She is not hearing aid and at times difficult to connect or had to repeat questions.  She does withdraw and had crying spells. Distrubed apetite, sleep and energy level. No suicidal or homicidal ideations. Feels energy has remained low and gets amotivated. Added memory concerns or not feeling sharp makes her more upset. She is worried of future and feels blah more so in the morning. Feels apetite and intake of food has been less    Denies psychosis or paranoia or hallucinations  Aggravating factors:  widow , lives by herself Modifying factors friend, sister, church, pet  Duration more so last 3 yeas  Psych admission denies  Suicide attempt denies Past Psychiatric History: depression, anxiety  Previous Psychotropic Medications: Yes   Substance Abuse History in the last 12 months:  No.  Consequences of Substance Abuse: NA  Past Medical History:  Past Medical History:  Diagnosis Date   Depression    GAD (generalized  anxiety disorder)    Heart murmur    History of colon polyps    Hypertension    Thyroid disease     Past Surgical History:  Procedure Laterality Date   CHOLECYSTECTOMY      Family Psychiatric History: denies. Mom had dementia  Family History:  Family History  Problem Relation Age of Onset   Dementia Mother    Hypertension Father    Cancer Father    Mesothelioma Father     Social History:   Social History   Socioeconomic History   Marital status: Widowed    Spouse name: Not on file   Number of children: Not on file   Years of education: Not on file   Highest education level: Not on file  Occupational History   Not on file  Tobacco Use   Smoking status: Never    Passive exposure: Never   Smokeless tobacco: Never  Vaping Use   Vaping status: Never Used  Substance and Sexual Activity   Alcohol use: Not Currently   Drug use: Never   Sexual activity: Not on file  Other Topics Concern   Not on file  Social History Narrative   Not on file   Social Determinants of Health   Financial Resource Strain: Low Risk  (02/28/2022)   [facility] from [facility] visits prior to 09/09/2022., [facility] to [facility]/09/[facility]22[facility]   Overall Financial Resource Strain (CARDIA)    Difficulty  of Paying Living Expenses: Not hard at all  Food Insecurity: No Food Insecurity (02/28/2022)   [facility] prior to 09/09/2022., [facility] prior to 09/09/2022., [facility]   Hunger Vital Sign    Worried About Running Out of Food in the Last Year: Never true    Ran Out of Food in the Last Year: Never true  Transportation Needs: No Transportation Needs (02/28/2022)   [facility] visits prior to 09/09/2022., [facility] visits prior to 09/09/2022., [facility]   PRAPARE - Transportation    Lack of Transportation  (Medical): No    Lack of Transportation (Non-Medical): No  Physical Activity: Insufficiently Active (02/28/2022)   [facility] from [facility] visits prior to 09/09/2022., [facility] visits prior to 09/09/2022., [facility]   Exercise Vital Sign    Days of Exercise per Week: 4 days    Minutes of Exercise per Session: 30 min  Stress: Stress Concern Present (02/28/2022)   Received from [facility] visits prior to 09/09/2022., [facility] visits prior to 09/09/2022[facility]   [employer] of Occupational Health - Occupational Stress Questionnaire    Feeling of Stress : To some extent  Social Connections: Moderately Integrated (02/28/2022)   [facility] visits prior to 09/09/2022., [facility] visits prior to 09/09/2022[facility]   Social Connection and Isolation Panel [NHANES]    Frequency of Communication with Friends and Family: More than three times a week    Frequency of Social Gatherings with Friends and Family: Three times a week    Attends Religious Services: More than 4 times per year    Active Member of Clubs or Organizations: Not on file    Attends Club or Organization Meetings: 1 to 4 times per year    Marital Status: Widowed    Additional Social History: grew up with parents, mom was challenging. No otherwise abuse  Married for 47 years he was a Cytogeneticist and rough, difficult marriage. Now a widow. No kids  Allergies:   Allergies  Allergen Reactions   Risedronate Other (See Comments)    Joint pain Joint pain     Metabolic Disorder Labs: No results found for: "HGBA1C", "MPG" No results found for: "PROLACTIN" No results found for: "CHOL", "TRIG", "HDL", "CHOLHDL", "VLDL", "LDLCALC" No results found for: "TSH"  Therapeutic Level Labs: No results found for: "LITHIUM" No results found for: "CBMZ" No results found for:  "VALPROATE"  Current Medications: Current Outpatient Medications  Medication Sig Dispense Refill   alendronate (FOSAMAX) 70 MG tablet Take 1 tablet in the morning weekly with a full glass of water on empty stomach     atorvastatin (LIPITOR) 10 MG tablet Take 1 tablet by mouth daily.     azithromycin (ZITHROMAX) 250 MG tablet Take 1 tablet (250 mg total) by mouth daily. Take first 2 tablets together, then 1 every day until finished. 6 tablet 0   brimonidine (ALPHAGAN) 0.2 % ophthalmic solution 1 drop 2 (two) times daily.     buPROPion (WELLBUTRIN XL) 150 MG 24 hr tablet Take by mouth.     Calcium Carbonate-Vitamin D 600-200 MG-UNIT TABS Take 1 tablet by mouth daily.     dorzolamide-timolol (COSOPT) 22.3-6.8 MG/ML ophthalmic solution Place 1 drop into both eyes 2 times daily.  escitalopram (LEXAPRO) 10 MG tablet Take by mouth.     escitalopram (LEXAPRO) 5 MG tablet Take 1 tablet (5 mg total) by mouth daily. 30 tablet 0   losartan (COZAAR) 25 MG tablet TAKE 1/2 TABLET (12.5MG ) BY MOUTH DAILY     Multiple Vitamin (MULTI-VITAMIN) tablet Take 1 tablet by mouth daily.     Omega-3 Fatty Acids (FISH OIL) 1000 MG CAPS 1 capsule daily after lunch.     predniSONE (STERAPRED UNI-PAK 21 TAB) 10 MG (21) TBPK tablet Take by mouth daily. Take 6 tabs by mouth daily  for 2 days, then 5 tabs for 2 days, then 4 tabs for 2 days, then 3 tabs for 2 days, 2 tabs for 2 days, then 1 tab by mouth daily for 2 days 42 tablet 0   vitamin B-12 (CYANOCOBALAMIN) 250 MCG tablet Take by mouth.     No current facility-administered medications for this visit.     Psychiatric Specialty Exam: Review of Systems  Cardiovascular:  Negative for chest pain.  Psychiatric/Behavioral:  Positive for decreased concentration. Negative for suicidal ideas.     Blood pressure (!) 122/90, pulse (!) 105, height 5\' 7"  (1.702 m), weight 120 lb (54.4 kg).Body mass index is 18.79 kg/m.  General Appearance: Casual  Eye Contact:  Fair   Speech:  Slow  Volume:  Decreased  Mood:  Depressed  Affect:  Constricted and Depressed  Thought Process:  Goal Directed  Orientation:  Full (Time, Place, and Person)  Thought Content:  Rumination  Suicidal Thoughts:  No  Homicidal Thoughts:  No  Memory:  Immediate;   Fair  Judgement:  Fair  Insight:  Shallow  Psychomotor Activity:  Decreased  Concentration:  Concentration: Fair  Recall:  [employer] of Knowledge:Fair  Language: Fair  Akathisia:  No  Handed:    AIMS (if indicated):  not done  Assets:  Desire for Improvement Financial Resources/Insurance  ADL's:  Intact  Cognition: WNL, recall 2/3, some difficult multitasking  Sleep:  Fair   Screenings: PHQ2-9    Flowsheet Row Office Visit from 01/30/2023 in [facility]  PHQ-2 Total Score 2  PHQ-9 Total Score 7      Flowsheet Row Office Visit from 01/30/2023 in [facility] ED from [facility]/04/2022 [facility] ED from 12/29/2021 in [facility] RISK CATEGORY No Risk No Risk No Risk       Assessment and Plan: as follows  MDD recurrent moderate: on lexapro will increase from 10 to 15mg .  Continue wellbutrin   GAd" increase lexapro to 15mg   and schedule therapy to work on anxiety, coping skills and managing stressors  Avoid energy drinks she has been using as it can make anxiety worse Avoid xanax as it can effect memory , she takes small dose prn for worsening of anxiety symptoms,    Memory changes: did not bring her hearing aid so had difficulty connecting with questions . She is schedule to see Neurology for work up highly recommend that considering her frustration of forgetting things. She does have family history or mom had dementia  Provided supportive therpay, questions addressed and follow up in 4 weeks or earlier if needed Discussed treatment  plan with her sister as well and to re confirm not to miss neurology appointment  Discussed regular exercises, reading books and engaging in activities.    Direct care time  spent 60 min including chart review, documentation  Collaboration of Care: Primary Care Provider AEB reviewed notes and reviewed  Patient/Guardian was advised Release of Information must be obtained prior to any record release in order to collaborate their care with an outside provider. Patient/Guardian was advised if they have not already done so to contact the registration department to sign all necessary forms in order for [place] to release information regarding their care.   Consent: Patient/Guardian gives verbal consent for treatment and assignment of benefits for services provided during this visit. Patient/Guardian expressed understanding and agreed to proceed.   Thresa Ross, MD 7/23/202411:23 AM

## 2023-01-31 ENCOUNTER — Telehealth (HOSPITAL_COMMUNITY): Payer: Self-pay | Admitting: Psychiatry

## 2023-01-31 NOTE — Telephone Encounter (Signed)
Patient and sister Brandy Huffman came into the clinic requesting clarification regarding dosage of escitalopram (LEXAPRO). Stated they had understood during new patient appointment on 01/30/2023 that provider planned to increase dosage from 10 mg (as reflected in the patient's chart) to 15 mg and they picked up a prescription of 5 mg tablets from the pharmacy. However, on further scrutiny of the pill bottle from previous prescriber they realized the previous dosage is 20 mg not 10 mg. They presented the pill bottle for authentication and the office visit note from PCP on 01/18/2023 also reflects the 20 mg dose. Patient and sister are requesting clarification of recommended dosage. DPR signed by patient while in clinic to allow this and other PHI to be shared with patient's sister Brandy Huffman ([phone]).

## 2023-02-01 ENCOUNTER — Telehealth (HOSPITAL_COMMUNITY): Payer: Self-pay | Admitting: *Deleted

## 2023-02-01 NOTE — Telephone Encounter (Signed)
Opened in Error.

## 2023-02-27 ENCOUNTER — Ambulatory Visit (INDEPENDENT_AMBULATORY_CARE_PROVIDER_SITE_OTHER): Payer: Medicare PPO | Admitting: Psychiatry

## 2023-02-27 ENCOUNTER — Encounter (HOSPITAL_COMMUNITY): Payer: Self-pay | Admitting: Psychiatry

## 2023-02-27 VITALS — BP 150/90 | HR 89 | Ht 67.0 in | Wt 120.0 lb

## 2023-02-27 DIAGNOSIS — F411 Generalized anxiety disorder: Secondary | ICD-10-CM

## 2023-02-27 DIAGNOSIS — F331 Major depressive disorder, recurrent, moderate: Secondary | ICD-10-CM | POA: Diagnosis not present

## 2023-02-27 DIAGNOSIS — F418 Other specified anxiety disorders: Secondary | ICD-10-CM

## 2023-02-27 DIAGNOSIS — R413 Other amnesia: Secondary | ICD-10-CM | POA: Diagnosis not present

## 2023-02-27 MED ORDER — ESCITALOPRAM OXALATE 10 MG PO TABS: 10.0000 mg | ORAL_TABLET | Freq: Every day | ORAL | 0 refills | Status: DC

## 2023-02-27 MED ORDER — ESCITALOPRAM OXALATE 5 MG PO TABS: 5.0000 mg | ORAL_TABLET | Freq: Every day | ORAL | 0 refills | Status: DC

## 2023-02-27 NOTE — Progress Notes (Signed)
[facility] Follow up visit   Patient Identification: Brandy Huffman MRN:  [id number] Date of Evaluation:  02/27/2023 Referral Source: primary care Chief Complaint:   Chief Complaint  Patient presents with   Follow-up   Visit Diagnosis:    ICD-10-CM   1. Depression with anxiety  F41.8     2. MDD (major depressive disorder), recurrent episode, moderate (HCC)  F33.1     3. GAD (generalized anxiety disorder)  F41.1     4. Memory changes  R41.3       History of Present Illness:  72 years old white widow, referred initially by PCP to eval for depression and manage. She is retired. Lives by herself, does like living by herself but noticing increase depression, anxiety  Patient had recent cataract surgery and went well but feel exacerbation of anxiety, depression and forgetfullness. She had at times screamed and yelled when she forgets things or where she has placed things. Gets anxious, worriful and makes her upset.    Last visit lexapro was increased to 15mg , still gets withdrawn, decreased energy, gets forgetful and is following with neurology. Pending MRI for evaluation     Denies psychosis or paranoia or hallucinations  Aggravating factors:  widow , lives by herself Modifying factors friend, sister, church, pet   Duration more so last 3 yeas  Psych admission denies  Suicide attempt denies Past Psychiatric History: depression, anxiety  Previous Psychotropic Medications: Yes   Substance Abuse History in the last 12 months:  No.  Consequences of Substance Abuse: NA  Past Medical History:  Past Medical History:  Diagnosis Date   Depression    GAD (generalized anxiety disorder)    Heart murmur    History of colon polyps    Hypertension    Thyroid disease     Past Surgical History:  Procedure Laterality Date   CHOLECYSTECTOMY      Family Psychiatric History: denies. Mom had dementia  Family History:  Family History  Problem Relation Age of Onset   Dementia Mother     Hypertension Father    Cancer Father    Mesothelioma Father     Social History:   Social History   Socioeconomic History   Marital status: Widowed    Spouse name: Not on file   Number of children: Not on file   Years of education: Not on file   Highest education level: Not on file  Occupational History   Not on file  Tobacco Use   Smoking status: Never    Passive exposure: Never   Smokeless tobacco: Never  Vaping Use   Vaping status: Never Used  Substance and Sexual Activity   Alcohol use: Not Currently   Drug use: Never   Sexual activity: Not on file  Other Topics Concern   Not on file  Social History Narrative   Not on file   Social Determinants of Health   Financial Resource Strain: Low Risk  (02/09/2023)   Received from [facility]   Overall Financial Resource Strain (CARDIA)    Difficulty of Paying Living Expenses: Not hard at all  Food Insecurity: No Food Insecurity (02/09/2023)   Received from [facility]   Hunger Vital Sign    Worried About Running Out of Food in the Last Year: Never true    Ran Out of Food in the Last Year: Never true  Transportation Needs: No Transportation Needs (02/09/2023)   Received from [facility] - Transportation    Lack  of Transportation (Medical): No    Lack of Transportation (Non-Medical): No  Physical Activity: Insufficiently Active (02/28/2022)   Received from [facility] visits prior to 09/09/2022., [facility] visits prior to 09/09/2022., [facility]   Exercise Vital Sign    Days of Exercise per Week: 4 days    Minutes of Exercise per Session: 30 min  Stress: Stress Concern Present (02/28/2022)   Received from [facility] visits prior to 09/09/2022., [facility] visits prior to 09/09/2022., [facility]   [employer] of Occupational Health - Occupational Stress Questionnaire    Feeling of Stress : To some extent  Social  Connections: Moderately Integrated (02/28/2022)   Received from [facility] visits prior to 09/09/2022., [facility] visits prior to 09/09/2022., [facility]   Social Connection and Isolation Panel [NHANES]    Frequency of Communication with Friends and Family: More than three times a week    Frequency of Social Gatherings with Friends and Family: Three times a week    Attends Religious Services: More than 4 times per year    Active Member of Clubs or Organizations: Not on file    Attends Club or Organization Meetings: 1 to 4 times per year    Marital Status: Widowed      Allergies:   Allergies  Allergen Reactions   Risedronate Other (See Comments)    Joint pain Joint pain     Metabolic Disorder Labs: No results found for: "HGBA1C", "MPG" No results found for: "PROLACTIN" No results found for: "CHOL", "TRIG", "HDL", "CHOLHDL", "VLDL", "LDLCALC" No results found for: "TSH"  Therapeutic Level Labs: No results found for: "LITHIUM" No results found for: "CBMZ" No results found for: "VALPROATE"  Current Medications: Current Outpatient Medications  Medication Sig Dispense Refill   alendronate (FOSAMAX) 70 MG tablet Take 1 tablet in the morning weekly with a full glass of water on empty stomach     atorvastatin (LIPITOR) 10 MG tablet Take 1 tablet by mouth daily.     azithromycin (ZITHROMAX) 250 MG tablet Take 1 tablet (250 mg total) by mouth daily. Take first 2 tablets together, then 1 every day until finished. 6 tablet 0   brimonidine (ALPHAGAN) 0.2 % ophthalmic solution 1 drop 2 (two) times daily.     buPROPion (WELLBUTRIN XL) 150 MG 24 hr tablet Take by mouth.     Calcium Carbonate-Vitamin D 600-200 MG-UNIT TABS Take 1 tablet by mouth daily.     dorzolamide-timolol (COSOPT) 22.3-6.8 MG/ML ophthalmic solution Place 1 drop into both eyes 2 times daily.     escitalopram (LEXAPRO) 10 MG tablet Take by mouth.     escitalopram (LEXAPRO) 5 MG  tablet Take 1 tablet (5 mg total) by mouth daily. 30 tablet 0   Multiple Vitamin (MULTI-VITAMIN) tablet Take 1 tablet by mouth daily.     Omega-3 Fatty Acids (FISH OIL) 1000 MG CAPS 1 capsule daily after lunch.     predniSONE (STERAPRED UNI-PAK 21 TAB) 10 MG (21) TBPK tablet Take by mouth daily. Take 6 tabs by mouth daily  for 2 days, then 5 tabs for 2 days, then 4 tabs for 2 days, then 3 tabs for 2 days, 2 tabs for 2 days, then 1 tab by mouth daily for 2 days 42 tablet 0   vitamin B-12 (CYANOCOBALAMIN) 250 MCG tablet Take by mouth.     losartan (COZAAR) 25 MG tablet  TAKE 1/2 TABLET (12.5MG ) BY MOUTH DAILY (Patient not taking: Reported on 02/27/2023)     No current facility-administered medications for this visit.     Psychiatric Specialty Exam: Review of Systems  Cardiovascular:  Negative for chest pain.  Psychiatric/Behavioral:  Positive for decreased concentration and dysphoric mood. Negative for suicidal ideas.     Blood pressure (!) 150/90, pulse 89, height 5\' 7"  (1.702 m), weight 120 lb (54.4 kg).Body mass index is 18.79 kg/m.  General Appearance: Casual  Eye Contact:  Fair  Speech:  Slow  Volume:  Decreased  Mood:  subdued  Affect:  constricted  Thought Process:  Goal Directed  Orientation:  Full (Time, Place, and Person)  Thought Content:  Rumination  Suicidal Thoughts:  No  Homicidal Thoughts:  No  Memory:  Immediate;   Fair  Judgement:  Fair  Insight:  Shallow  Psychomotor Activity:  Decreased  Concentration:  Concentration: Fair  Recall:  [employer] of Knowledge:Fair  Language: Fair  Akathisia:  No  Handed:    AIMS (if indicated):  not done  Assets:  Desire for Improvement Financial Resources/Insurance  ADL's:  Intact  Cognition: WNL, recall 2/3, some difficult multitasking  Sleep:  Fair   Screenings: PHQ2-9    Flowsheet Row Office Visit from 01/30/2023 in [facility]  PHQ-2 Total Score 2  PHQ-9 Total  Score 7      Flowsheet Row Office Visit from 01/30/2023 in [facility] at [facility] ED from 05[facility]04/2022 in [facility] ED from 12/29/2021 in [facility] RISK CATEGORY No Risk No Risk No Risk       Assessment and Plan: as follows Prior documentation reviewed  MDD recurrent moderate: last visit increased lexapro to 15mg , not much of difference. Feel low energy, going thru Neuro workup, will increase lexapro to 30mg , 20 plus 10mg .   GAd" : worries related to what to do and low energy, will increase lexapro to 30mg    Has cut down the energy drinks, is avoiding xanax as well since she is aware it will effect memory   Memory changes: gets frustrated, now doing work up with neurology   Fu 72m.   Provided supportive therpay, questions addressed and follow up in 4 weeks or earlier if needed Discussed treatment plan with her sister as well and to re confirm not to miss neurology appointments  Discussed regular exercises, reading books and engaging in activities.    Direct care time spent 20 minutes including chart review, documentation  Collaboration of Care: Primary Care Provider AEB reviewed notes and reviewed  Patient/Guardian was advised Release of Information must be obtained prior to any record release in order to collaborate their care with an outside provider. Patient/Guardian was advised if they have not already done so to contact the registration department to sign all necessary forms in order for [place] to release information regarding their care.   Consent: Patient/Guardian gives verbal consent for treatment and assignment of benefits for services provided during this visit. Patient/Guardian expressed understanding and agreed to proceed.   Thresa Ross, MD 8/20/20241:59 PM

## 2023-02-28 ENCOUNTER — Ambulatory Visit (INDEPENDENT_AMBULATORY_CARE_PROVIDER_SITE_OTHER): Payer: Medicare PPO | Admitting: Licensed Clinical Social Worker

## 2023-02-28 ENCOUNTER — Encounter (HOSPITAL_COMMUNITY): Payer: Self-pay

## 2023-02-28 DIAGNOSIS — F4323 Adjustment disorder with mixed anxiety and depressed mood: Secondary | ICD-10-CM | POA: Diagnosis not present

## 2023-03-01 NOTE — Progress Notes (Signed)
Comprehensive Clinical Assessment (CCA) Note  03/01/2023 Brandy Huffman [id number]  Chief Complaint:  Chief Complaint  Patient presents with   Adjustment Disorder   Visit Diagnosis: Adjustment disorder with mixed anxiety and depressed mood      CCA Biopsychosocial Intake/Chief Complaint:  About 3 years ago, Patient was getting frustrated with herself if she couldn't find something and would yell/curse due to this.  Current Symptoms/Problems: body weakness which is frustrating her, and feels scared due to her weakness, has been experiencing nausea when she doesn't eat, tearfulness, irritability, memory issues/forgetting things, slight change in appetite/reduced, weight staying the same, No SI/HI, no psychosis   Patient Reported Schizophrenia/Schizoaffective Diagnosis in Past: No   Strengths: have a big heart, play guitar, supports community causes  Preferences: doesn't prefer when people tell her what to do, doesn't prefer loud people, prefers the outdoors  Abilities: quilts, knit and crochet   Type of Services Patient Feels are Needed: therapy, medication   Initial Clinical Notes/Concerns: Symptoms started 3 years ago when she noticed she couldn't remember things, symptoms occur daily, symptoms are moderate to severe   Mental Health Symptoms Depression:  Irritability; Tearfulness; Weight gain/loss; Increase/decrease in appetite   Duration of Depressive symptoms: Greater than two weeks   Mania:  None   Anxiety:   Worrying; Irritability; Fatigue   Psychosis:  None   Duration of Psychotic symptoms: No data recorded  Trauma:  None   Obsessions:  None   Compulsions:  None   Inattention:  None   Hyperactivity/Impulsivity:  None   Oppositional/Defiant Behaviors:  None   Emotional Irregularity:  None   Other Mood/Personality Symptoms:  None    Mental Status Exam Appearance and self-care  Stature:  Average   Weight:  Thin   Clothing:  Casual   Grooming:   Normal   Cosmetic use:  Age appropriate   Posture/gait:  Normal   Motor activity:  Not Remarkable   Sensorium  Attention:  Normal   Concentration:  Normal   Orientation:  X5   Recall/memory:  Normal   Affect and Mood  Affect:  Appropriate   Mood:  Depressed   Relating  Eye contact:  Normal   Facial expression:  Responsive   Attitude toward examiner:  Cooperative   Thought and Language  Speech flow: Normal   Thought content:  Appropriate to Mood and Circumstances   Preoccupation:  None   Hallucinations:  None   Organization:  No data recorded  [employer] of Knowledge:  Good   Intelligence:  Average   Abstraction:  Normal   Judgement:  Good   Reality Testing:  Realistic   Insight:  Good   Decision Making:  Normal   Social Functioning  Social Maturity:  Responsible   Social Judgement:  Normal   Stress  Stressors:  Illness   Coping Ability:  Human resources officer Deficits:  Self-care   Supports:  Family; Church     Religion: Religion/Spirituality Are You A Religious Person?: Yes What is Your Religious Affiliation?: Methodist How Might This Affect Treatment?: Support in treatment  Leisure/Recreation: Leisure / Recreation Do You Have Hobbies?: Yes Leisure and Hobbies: knit, crochet, quilt, read, spend time with friends  Exercise/Diet: Exercise/Diet Do You Exercise?:  (walks her dog, tai chi at times) Have You Gained or Lost A Significant Amount of Weight in the Past Six Months?: No Do You Follow a Special Diet?: Yes Type of Diet: increase protein Do You Have Any Trouble Sleeping?:  No   CCA Employment/Education Employment/Work Situation: 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?:  No  Education: Education Is Patient Currently Attending School?: No Last Grade Completed: 12 Name of High School: [employer] Did Garment/textile technologist From [employer]?: Yes Did Theme park manager?: No Did Designer, television/film set?: No Did You Have Any Special Interests In School?: Typing Did You Have An Individualized Education Program (IIEP): No Did You Have Any Difficulty At School?: No Patient's Education Has Been Impacted by Current Illness: No   CCA Family/Childhood History Family and Relationship History: Family history Marital status: Widowed Widowed, when?: 2019 (Didn't have a good marriage) Are you sexually active?: No What is your sexual orientation?: N/A Has your sexual activity been affected by drugs, alcohol, medication, or emotional stress?: N/A Does patient have children?: No  Childhood History:  Childhood History Additional childhood history information: Both parents in the home. Parents worked a lot. Patient describes childhood as "wasn't bad, but mother was perfectionist." Description of patient's relationship with caregiver when they were a child: Mother: ok, Father: closer to father Patient's description of current relationship with people who raised him/her: Mother: deceased, Father: deceased How were you disciplined when you got in trouble as a child/adolescent?: talked to Does patient have siblings?: Yes Number of Siblings: 1 Description of patient's current relationship with siblings: Sister: good Did patient suffer any verbal/emotional/physical/sexual abuse as a child?: No Did patient suffer from severe childhood neglect?: No Has patient ever been sexually abused/assaulted/raped as an adolescent or adult?: No Was the patient ever a victim of a crime or a disaster?: No Witnessed domestic violence?: No Has patient been affected by domestic violence as an adult?: No  Child/Adolescent Assessment:     CCA Substance Use Alcohol/Drug  Use: Alcohol / Drug Use Pain Medications: See patient MAR Prescriptions: See patient MAR Over the Counter: See patient MAR History of alcohol / drug use?: No history of alcohol / drug abuse                         ASAM's:  Six Dimensions of Multidimensional Assessment  Dimension 1:  Acute Intoxication and/or Withdrawal Potential:   Dimension 1:  Description of individual's past and current experiences of substance use and withdrawal: NOne  Dimension 2:  Biomedical Conditions and Complications:   Dimension 2:  Description of patient's biomedical conditions and  complications: None  Dimension 3:  Emotional, Behavioral, or Cognitive Conditions and Complications:  Dimension 3:  Description of emotional, behavioral, or cognitive conditions and complications: None  Dimension 4:  Readiness to Change:  Dimension 4:  Description of Readiness to Change criteria: None  Dimension 5:  Relapse, Continued use, or Continued Problem Potential:  Dimension 5:  Relapse, continued use, or continued problem potential critiera description: None  Dimension 6:  Recovery/Living Environment:  Dimension 6:  Recovery/Iiving environment criteria description: None  ASAM Severity Score: ASAM's Severity Rating Score: 0  ASAM Recommended Level of Treatment:     Substance use Disorder (SUD)    Recommendations for Services/Supports/Treatments: Recommendations for Services/Supports/Treatments Recommendations For Services/Supports/Treatments: Individual Therapy, Medication Management  DSM5 Diagnoses: Patient Active Problem List   Diagnosis Date Noted   Heart murmur 08/17/2020   IBS (irritable bowel syndrome) 08/17/2020  Essential hypertension 02/20/2020   Depression with anxiety 02/13/2013    Patient Centered Plan: Patient is on the following Treatment Plan(s):  Depression   Referrals to Alternative Service(s): Referred to Alternative Service(s):   Place:   Date:   Time:    Referred to Alternative  Service(s):   Place:   Date:   Time:    Referred to Alternative Service(s):   Place:   Date:   Time:    Referred to Alternative Service(s):   Place:   Date:   Time:      Collaboration of Care: Psychiatrist AEB review documents and consult as needed on patient progress.   Patient/Guardian was advised Release of Information must be obtained prior to any record release in order to collaborate their care with an outside provider. Patient/Guardian was advised if they have not already done so to contact the registration department to sign all necessary forms in order for [place] to release information regarding their care.   Consent: Patient/Guardian gives verbal consent for treatment and assignment of benefits for services provided during this visit. Patient/Guardian expressed understanding and agreed to proceed.   Bynum Bellows, LCSW

## 2023-03-27 ENCOUNTER — Ambulatory Visit (HOSPITAL_COMMUNITY): Payer: Medicare PPO | Admitting: Psychiatry

## 2023-03-28 ENCOUNTER — Ambulatory Visit (INDEPENDENT_AMBULATORY_CARE_PROVIDER_SITE_OTHER): Payer: Medicare PPO | Admitting: Licensed Clinical Social Worker

## 2023-03-28 DIAGNOSIS — F4323 Adjustment disorder with mixed anxiety and depressed mood: Secondary | ICD-10-CM

## 2023-03-28 NOTE — Progress Notes (Unsigned)
   THERAPIST PROGRESS NOTE  Session Time: 2:00 am-2:45 am  Type of Therapy: Individual Therapy  Purpose of session: Brandy Huffman will manage mood as evidenced by identifying and challenging negative and anxious thoughts, increase realistic and positive thinking, and cope with daily stressors for 5 out of 7 days for 60 days.  ProgressTowards Goals: Initial  Interventions: Therapist utilized CBT, Solution focused brief therapy, and ACT to address anxious thoughts. Therapist administered PHQ9 and GAD7 to patient during session. Therapist provided psychoeducation to patient on CBT. Therapist worked with patient to connect her thoughts, feelings, and behaviors.  Effectiveness: Patient was oriented x4 (person, place, situation, and time). Patient was casually dressed, and appropriately groomed. Patient was alert, engaged, pleasant, and cooperative. Patient completed the PHQ9 with a score of 1 indicating minimal or no depressive symptoms. Patient completed the GAD7 with a score 1 of indicating minimal or no anxious symptoms. Patient reports feeling better. She is feeling mild irritability at times. Patient understood CBT and how her thoughts, feelings, and behaviors are connected. Patient noted that when she misplaces her wallet she thinks someone got her wallet, she has lost her id and money, and she will have to cancel her cards, replace her id, etc. This makes her feel worried and angry. Patient will then scream when this happens. Patient noted she needs to "chill." She will try to calm herself down with breathing, talking to a friend, or doing something to distract her.   Patient engaged in session. She responded well to interventions. Patient continues to meet criteria for Adjustment disorder with anxiety and depressed mood. Patient will continue in outpatient therapy due to being the least restrictive service to meet her needs. Patient made minimal progress on her goals.    Suicidal/Homicidal: Nowithout  intent/plan  Plan: Return again in 2-4 weeks.  Diagnosis: No diagnosis found.  Collaboration of Care: Psychiatrist AEB consult with Dr. Gilmore Laroche   Patient/Guardian was advised Release of Information must be obtained prior to any record release in order to collaborate their care with an outside provider. Patient/Guardian was advised if they have not already done so to contact the registration department to sign all necessary forms in order for [place] to release information regarding their care.   Consent: Patient/Guardian gives verbal consent for treatment and assignment of benefits for services provided during this visit. Patient/Guardian expressed understanding and agreed to proceed.   Brandy Bellows, LCSW 03/28/2023

## 2023-04-02 ENCOUNTER — Other Ambulatory Visit (HOSPITAL_COMMUNITY): Payer: Self-pay | Admitting: Psychiatry

## 2023-04-10 ENCOUNTER — Ambulatory Visit (INDEPENDENT_AMBULATORY_CARE_PROVIDER_SITE_OTHER): Payer: Medicare PPO | Admitting: Licensed Clinical Social Worker

## 2023-04-10 DIAGNOSIS — F4323 Adjustment disorder with mixed anxiety and depressed mood: Secondary | ICD-10-CM

## 2023-04-11 NOTE — Progress Notes (Signed)
THERAPIST PROGRESS NOTE  Session Time: 2:00 pm-2:45 pm  Type of Therapy: Individual Therapy  Purpose of session: Aldea will manage mood as evidenced by identifying and challenging negative and anxious thoughts, increase realistic and positive thinking, and cope with daily stressors for 5 out of 7 days for 60 days.  ProgressTowards Goals: Initial  Interventions: Therapist utilized CBT, Solution focused brief therapy, and ACT to address anxious thoughts. Therapist administered PHQ9 and GAD7 to patient during session. Therapist worked with patient on her challenging her negative and anxious thoughts.    Effectiveness: Patient was oriented x4 (person, place, situation, and time). Patient was casually dressed, and appropriately groomed. Patient was alert, engaged, pleasant, and cooperative. Patient completed the PHQ9 with a score of 0 indicating minimal or no depressive symptoms. Patient completed the GAD7 with a score 0 of indicating minimal or no anxious symptoms. Patient noted that she has had moments of anxiety and irritability. Patient noted that she is frustrated over her loss of memory. Patient gets frustrated at her "dumb phone" and not being able to pay bills on it. Patient has asked for the company to send her a paper bill but they continue to send electronic bills. She also noted that she gets frustrated at loud children in church. Patient noted that her phone makes her feel "helpless" and she starts to worry about getting her bill paid. Patient identified her thoughts about loud children as "disrepectful, chaos, and a different world from when she grew up." Patient noted a small step she could take is to reduce her swearing when she is angry. She noted this happens 2 to 3 days a week. She is wanting to reduce it to one day a week. Patient is going to remind herself that she wasn't raised that way and that if she lets the anger or worry get to her it can impact her health.    Patient engaged  in session. She responded well to interventions. Patient continues to meet criteria for Adjustment disorder with anxiety and depressed mood. Patient will continue in outpatient therapy due to being the least restrictive service to meet her needs. Patient made minimal progress on her goals.   Flowsheet Row Counselor from 04/10/2023 in [facility] at [facility] Counselor from 02/28/2023 in [facility] at [facility] Office Visit from 01/30/2023 in [facility] at [facility]  Thoughts that you would be better off dead, or of hurting yourself in some way Not at all Not at all Not at all  PHQ-9 Total Score 0 4 7          04/10/2023    2:21 PM  GAD 7 : Generalized Anxiety Score  Nervous, Anxious, on Edge 0  Control/stop worrying 0  Worry too much - different things 0  Trouble relaxing 0  Restless 0  Easily annoyed or irritable 0  Afraid - awful might happen 0  Total GAD 7 Score 0  Anxiety Difficulty Not difficult at all      Suicidal/Homicidal: Nowithout intent/plan  Plan: Return again in 2-4 weeks.  Diagnosis: Adjustment disorder with mixed anxiety and depressed mood  Collaboration of Care: Psychiatrist AEB consult with Dr. Gilmore Laroche   Patient/Guardian was advised Release of Information must be obtained prior to any record release in order to collaborate their care with an outside provider. Patient/Guardian was advised if they have not already done so to contact the registration department to sign all necessary  forms in order for [place] to release information regarding their care.   Consent: Patient/Guardian gives verbal consent for treatment and assignment of benefits for services provided during this visit. Patient/Guardian expressed understanding and agreed to proceed.   Bynum Bellows, LCSW 04/11/2023

## 2023-04-17 ENCOUNTER — Ambulatory Visit (INDEPENDENT_AMBULATORY_CARE_PROVIDER_SITE_OTHER): Payer: Medicare PPO | Admitting: Psychiatry

## 2023-04-17 ENCOUNTER — Encounter (HOSPITAL_COMMUNITY): Payer: Self-pay | Admitting: Psychiatry

## 2023-04-17 VITALS — BP 137/90 | HR 105 | Ht 67.0 in | Wt 120.0 lb

## 2023-04-17 DIAGNOSIS — F418 Other specified anxiety disorders: Secondary | ICD-10-CM

## 2023-04-17 DIAGNOSIS — F4323 Adjustment disorder with mixed anxiety and depressed mood: Secondary | ICD-10-CM | POA: Diagnosis not present

## 2023-04-17 DIAGNOSIS — F411 Generalized anxiety disorder: Secondary | ICD-10-CM

## 2023-04-17 DIAGNOSIS — F331 Major depressive disorder, recurrent, moderate: Secondary | ICD-10-CM

## 2023-04-17 MED ORDER — ESCITALOPRAM OXALATE 10 MG PO TABS: 10.0000 mg | ORAL_TABLET | Freq: Every day | ORAL | 1 refills | Status: DC

## 2023-04-17 NOTE — Progress Notes (Signed)
[facility] Follow up visit   Patient Identification: Brandy Huffman MRN:  [id number] Date of Evaluation:  04/17/2023 Referral Source: primary care Chief Complaint:   Chief Complaint  Patient presents with   Follow-up   Visit Diagnosis:    ICD-10-CM   1. Depression with anxiety  F41.8     2. MDD (major depressive disorder), recurrent episode, moderate (HCC)  F33.1     3. GAD (generalized anxiety disorder)  F41.1     4. Adjustment disorder with mixed anxiety and depressed mood  F43.23       History of Present Illness:  72 years old white widow, referred initially by PCP to eval for depression and manage. She is retired. Lives by herself, does like living by herself but noticing increase depression, anxiety  Patient had recent cataract surgery and went well but feel exacerbation of anxiety, depression and forgetfullness. She had at times screamed and yelled when she forgets things or where she has placed things. Gets anxious, worriful and makes her upset.    Last visit increased lexapro to 30mg . Total is 20 plus 10mg  . Doing better, less anxious, less depressed, feels more motivated and not dwelling on worries. Goes outside, has a sister who helps her out. Not worried of memory concerns but is following with neurology for work up Therapy is helping in general depression has improved      Denies psychosis or paranoia or hallucinations  Aggravating factors:  widow, lives by herself Modifying factors friend, sister, church, pet  Duration more so last 3 yeas Severity improved Psych admission denies  Suicide attempt denies Past Psychiatric History: depression, anxiety  Previous Psychotropic Medications: Yes   Substance Abuse History in the last 12 months:  No.  Consequences of Substance Abuse: NA  Past Medical History:  Past Medical History:  Diagnosis Date   Depression    GAD (generalized anxiety disorder)    Heart murmur    History of colon polyps    Hypertension    Thyroid  disease     Past Surgical History:  Procedure Laterality Date   CHOLECYSTECTOMY      Family Psychiatric History: denies. Mom had dementia  Family History:  Family History  Problem Relation Age of Onset   Dementia Mother    Hypertension Father    Cancer Father    Mesothelioma Father     Social History:   Social History   Socioeconomic History   Marital status: Widowed    Spouse name: Not on file   Number of children: Not on file   Years of education: Not on file   Highest education level: Not on file  Occupational History   Not on file  Tobacco Use   Smoking status: Never    Passive exposure: Never   Smokeless tobacco: Never  Vaping Use   Vaping status: Never Used  Substance and Sexual Activity   Alcohol use: Not Currently   Drug use: Never   Sexual activity: Not on file  Other Topics Concern   Not on file  Social History Narrative   Not on file   Social Determinants of Health   Financial Resource Strain: Low Risk  (02/09/2023)   Received from [facility]   Overall Financial Resource Strain (CARDIA)    Difficulty of Paying Living Expenses: Not hard at all  Food Insecurity: No Food Insecurity (02/09/2023)   Received from [facility]   Hunger Vital Sign    Worried About Running Out of Food in the  Last Year: Never true    Ran Out of Food in the Last Year: Never true  Transportation Needs: No Transportation Needs (02/09/2023)   Received from [facility] - [facility]    Lack of Transportation (Medical): No    Lack of Transportation (Non-Medical): No  Physical Activity: Insufficiently Active (02/28/2022)   Received from [facility] visits prior to 09/09/2022., [facility] visits prior to 09/09/2022., [facility]   Exercise Vital Sign    Days of Exercise per Week: 4 days    Minutes of Exercise per Session: 30 min  Stress: Stress Concern Present (02/28/2022)   Received from [facility]  visits prior to 09/09/2022., [facility] visits prior to 09/09/2022., [facility]   [employer] of Occupational Health - Occupational Stress Questionnaire    Feeling of Stress : To some extent  Social Connections: Moderately Integrated (02/28/2022)   Received from [facility] visits prior to 09/09/2022., [facility] visits prior to 09/09/2022., [facility]   Social Connection and Isolation Panel [NHANES]    Frequency of Communication with Friends and Family: More than three times a week    Frequency of Social Gatherings with Friends and Family: Three times a week    Attends Religious Services: More than 4 times per year    Active Member of Clubs or Organizations: Not on file    Attends Club or Organization Meetings: 1 to 4 times per year    Marital Status: Widowed      Allergies:   Allergies  Allergen Reactions   Risedronate Other (See Comments)    Joint pain Joint pain     Metabolic Disorder Labs: No results found for: "HGBA1C", "MPG" No results found for: "PROLACTIN" No results found for: "CHOL", "TRIG", "HDL", "CHOLHDL", "VLDL", "LDLCALC" No results found for: "TSH"  Therapeutic Level Labs: No results found for: "LITHIUM" No results found for: "CBMZ" No results found for: "VALPROATE"  Current Medications: Current Outpatient Medications  Medication Sig Dispense Refill   alendronate (FOSAMAX) 70 MG tablet Take 1 tablet in the morning weekly with a full glass of water on empty stomach     atorvastatin (LIPITOR) 10 MG tablet Take 1 tablet by mouth daily.     azithromycin (ZITHROMAX) 250 MG tablet Take 1 tablet (250 mg total) by mouth daily. Take first 2 tablets together, then 1 every day until finished. 6 tablet 0   brimonidine (ALPHAGAN) 0.2 % ophthalmic solution 1 drop 2 (two) times daily.     buPROPion (WELLBUTRIN XL) 150 MG 24 hr tablet Take by mouth.     Calcium Carbonate-Vitamin D 600-200 MG-UNIT TABS  Take 1 tablet by mouth daily.     dorzolamide-timolol (COSOPT) 22.3-6.8 MG/ML ophthalmic solution Place 1 drop into both eyes 2 times daily.     escitalopram (LEXAPRO) 20 MG tablet Take 1 tablet by mouth daily.     losartan (COZAAR) 25 MG tablet      Multiple Vitamin (MULTI-VITAMIN) tablet Take 1 tablet by mouth daily.     Omega-3 Fatty Acids (FISH OIL) 1000 MG CAPS 1 capsule daily after lunch.     predniSONE (STERAPRED UNI-PAK 21 TAB) 10 MG (21) TBPK tablet Take by mouth daily. Take 6 tabs by mouth daily  for 2 days, then 5 tabs for 2 days, then 4 tabs for 2 days, then 3 tabs for 2 days, 2 tabs for 2  days, then 1 tab by mouth daily for 2 days 42 tablet 0   vitamin B-12 (CYANOCOBALAMIN) 250 MCG tablet Take by mouth.     escitalopram (LEXAPRO) 10 MG tablet Take 1 tablet (10 mg total) by mouth daily. 30 tablet 1   No current facility-administered medications for this visit.     Psychiatric Specialty Exam: Review of Systems  Cardiovascular:  Negative for chest pain.  Psychiatric/Behavioral:  Positive for decreased concentration and dysphoric mood. Negative for suicidal ideas.     Blood pressure (!) 137/90, pulse (!) 105, height 5\' 7"  (1.702 m), weight 120 lb (54.4 kg).Body mass index is 18.79 kg/m.  General Appearance: Casual  Eye Contact:  Fair  Speech:  Slow  Volume:  Decreased  Mood: better  Affect:  constricted  Thought Process:  Goal Directed  Orientation:  Full (Time, Place, and Person)  Thought Content:  Rumination  Suicidal Thoughts:  No  Homicidal Thoughts:  No  Memory:  Immediate;   Fair  Judgement:  Fair  Insight:  Shallow  Psychomotor Activity:  Decreased  Concentration:  Concentration: Fair  Recall:  [employer] of Knowledge:Fair  Language: Fair  Akathisia:  No  Handed:    AIMS (if indicated):  not done  Assets:  Desire for Improvement Financial Resources/Insurance  ADL's:  Intact  Cognition: WNL, recall 2/3, some difficult multitasking  Sleep:  Fair    Screenings: GAD-7    Advertising copywriter from 04/10/2023 in [facility]  Total GAD-7 Score 0      PHQ2-9    Flowsheet Row Counselor from 04/10/2023 in [facility] at [facility] Counselor from 02/28/2023 in [facility] at [facility] Office Visit from 01/30/2023 in [facility] at [facility]  PHQ-2 Total Score 0 2 2  PHQ-9 Total Score 0 4 7      Flowsheet Row Counselor from 02/28/2023 in [facility] at [facility] Office Visit from 01/30/2023 in [facility] at [facility] ED from 05/04/2022 in [facility] RISK CATEGORY No Risk No Risk No Risk       Assessment and Plan: as follows  Prior documentation reviewed  MDD recurrent moderate:  better continue lexapro 20 plus 10mg  chart shows wellbutrin in past states taking it, advised to continue taking med if she is taking it as the combination be helping and to bring botttles next time or call for refills when due  GAd" : worries have improved, continue therapy and lexapro   Has cut down the energy drinks, is avoiding xanax as well since she is aware it will effect memory   Memory changes: less frustrated and trying to keep busy but difficult at times. Continue lexapro and follow up with neurology  Fu 64m     Discussed regular exercises, reading books and engaging in activities.    Direct care time spent 20 minutes  including chart review, documentation  Collaboration of Care: Primary Care Provider AEB reviewed notes and reviewed  Patient/Guardian was advised Release of Information must be obtained prior to any record release in order to collaborate their care with an outside provider. Patient/Guardian was advised if they have not already done  so to contact the registration department to sign all necessary forms in order for [place] to release information regarding their care.   Consent: Patient/Guardian gives verbal consent for  treatment and assignment of benefits for services provided during this visit. Patient/Guardian expressed understanding and agreed to proceed.   Thresa Ross, MD 10/8/20243:34 PM

## 2023-04-23 ENCOUNTER — Ambulatory Visit (INDEPENDENT_AMBULATORY_CARE_PROVIDER_SITE_OTHER): Payer: Medicare PPO | Admitting: Licensed Clinical Social Worker

## 2023-04-23 DIAGNOSIS — F4323 Adjustment disorder with mixed anxiety and depressed mood: Secondary | ICD-10-CM | POA: Diagnosis not present

## 2023-04-24 NOTE — Progress Notes (Signed)
   THERAPIST PROGRESS NOTE  Session Time: 2:00 pm-2:45 pm  Type of Therapy: Individual Therapy  Purpose of session: Arzu will manage mood as evidenced by identifying and challenging negative and anxious thoughts, increase realistic and positive thinking, and cope with daily stressors for 5 out of 7 days for 60 days.  ProgressTowards Goals: Initial  Interventions: Therapist utilized CBT, Solution focused brief therapy, and ACT to address anxious thoughts. Therapist administered PHQ9 and GAD7 to patient during session. Therapist worked with patient on increasing realistic and positive thinking by getting back into hobbies she didn't think she could do any more.    Effectiveness: Patient was oriented x4 (person, place, situation, and time). Patient was casually dressed, and appropriately groomed. Patient was alert, engaged, pleasant, and cooperative. Patient completed the PHQ9 with a score of 2 indicating minimal or no depressive symptoms. Patient completed the GAD7 with a score 0 of indicating minimal or no anxious symptoms. Patient reported she has reduced her swearing a few days a week. She feels like she is doing better than before. Patient denies anxiety or depression. She has been sleeping well. Patient feels like she wants to pick up the guitar again and find a place to do zumba. Patient was able to identify what she likes about guitar and what she would start playing.   Patient engaged in session. She responded well to interventions. Patient continues to meet criteria for Adjustment disorder with anxiety and depressed mood. Patient will continue in outpatient therapy due to being the least restrictive service to meet her needs. Patient made moderate progress on her goals.   Flowsheet Row Counselor from 04/10/2023 in [facility] at [facility] Counselor from 02/28/2023 in [facility] Outpatient Behavioral Health at [facility] Office Visit from  01/30/2023 in [facility] at [facility]  Thoughts that you would be better off dead, or of hurting yourself in some way Not at all Not at all Not at all  PHQ-9 Total Score 0 4 7          04/10/2023    2:21 PM  GAD 7 : Generalized Anxiety Score  Nervous, Anxious, on Edge 0  Control/stop worrying 0  Worry too much - different things 0  Trouble relaxing 0  Restless 0  Easily annoyed or irritable 0  Afraid - awful might happen 0  Total GAD 7 Score 0  Anxiety Difficulty Not difficult at all      Suicidal/Homicidal: Nowithout intent/plan  Plan: Return again in 2-4 weeks.  Diagnosis: Adjustment disorder with mixed anxiety and depressed mood  Collaboration of Care: Psychiatrist AEB consult with Dr. Gilmore Laroche   Patient/Guardian was advised Release of Information must be obtained prior to any record release in order to collaborate their care with an outside provider. Patient/Guardian was advised if they have not already done so to contact the registration department to sign all necessary forms in order for [place] to release information regarding their care.   Consent: Patient/Guardian gives verbal consent for treatment and assignment of benefits for services provided during this visit. Patient/Guardian expressed understanding and agreed to proceed.   Bynum Bellows, LCSW 04/24/2023

## 2023-05-07 ENCOUNTER — Ambulatory Visit (HOSPITAL_COMMUNITY): Payer: Medicare PPO | Admitting: Licensed Clinical Social Worker

## 2023-05-08 ENCOUNTER — Ambulatory Visit (INDEPENDENT_AMBULATORY_CARE_PROVIDER_SITE_OTHER): Payer: Medicare PPO | Admitting: Licensed Clinical Social Worker

## 2023-05-08 DIAGNOSIS — F4323 Adjustment disorder with mixed anxiety and depressed mood: Secondary | ICD-10-CM | POA: Diagnosis not present

## 2023-05-08 NOTE — Progress Notes (Signed)
THERAPIST PROGRESS NOTE  Session Time: 1:55 pm-2:35 pm  Type of Therapy: Individual Therapy  Purpose of session: Brandy Huffman will manage mood as evidenced by identifying and challenging negative and anxious thoughts, increase realistic and positive thinking, and cope with daily stressors for 5 out of 7 days for 60 days.  ProgressTowards Goals: Met  Interventions: Therapist utilized CBT, Solution focused brief therapy, and ACT to address anxious thoughts. Therapist administered PHQ9 and GAD7 to patient during session. Therapist updated treatment plan with patient and reviewed goals.    Effectiveness: Patient was oriented x4 (person, place, situation, and time). Patient was casually dressed, and appropriately groomed. Patient was alert, engaged, pleasant, and cooperative. Patient completed the PHQ9 with a score of 1 indicating minimal or no depressive symptoms. Patient completed the GAD7 with a score 1 of indicating minimal or no anxious symptoms. Patient noted that she is doing well. She has had some minor worries over her dog. After discussion, patient was able to identify that she has no evidence that her dog is sick, etc and in fact that her dog is very energetic at the dog park or around others. Patient got her guitar out and tried to tune it but was unable to. She was able to avoid getting angry/upset at herself. Patient feels like she is doing better but still has difficulty with concentration at times. Patient is managing her mood and anxiety. Patient has achieved goals for treatment at this time and will return as needed in the future. Patient will continue to meet with psychiatrist, and neurologist. Patient will continue to attend church, eat regularly, try to exercise, be social, and pause before she reacts.   Patient engaged in session. She responded well to interventions. Patient continues to meet criteria for Adjustment disorder with anxiety and depressed mood. Patient will discontinue   outpatient therapy at this time. Patient met her goals.   Flowsheet Row Counselor from 05/08/2023 in [facility] at [facility] Counselor from 04/10/2023 in [facility] at [facility] Counselor from 02/28/2023 in [facility] at [facility]  Thoughts that you would be better off dead, or of hurting yourself in some way Not at all Not at all Not at all  PHQ-9 Total Score 1 0 4          05/08/2023    2:03 PM 04/10/2023    2:21 PM  GAD 7 : Generalized Anxiety Score  Nervous, Anxious, on Edge 0 0  Control/stop worrying 1 0  Worry too much - different things 0 0  Trouble relaxing 0 0  Restless 0 0  Easily annoyed or irritable 0 0  Afraid - awful might happen 0 0  Total GAD 7 Score 1 0  Anxiety Difficulty Somewhat difficult Not difficult at all      Suicidal/Homicidal: Nowithout intent/plan  Plan: Return as needed.   Diagnosis: Adjustment disorder with mixed anxiety and depressed mood  Collaboration of Care: Psychiatrist AEB consult with Dr. Gilmore Laroche   Patient/Guardian was advised Release of Information must be obtained prior to any record release in order to collaborate their care with an outside provider. Patient/Guardian was advised if they have not already done so to contact the registration department to sign all necessary forms in order for [place] to release information regarding their care.   Consent: Patient/Guardian gives verbal consent for treatment and assignment of benefits for services provided during this visit. Patient/Guardian expressed understanding and agreed to  proceed.   Bynum Bellows, LCSW 05/08/2023

## 2023-06-17 ENCOUNTER — Other Ambulatory Visit (HOSPITAL_COMMUNITY): Payer: Self-pay | Admitting: Psychiatry

## 2023-07-17 ENCOUNTER — Ambulatory Visit (INDEPENDENT_AMBULATORY_CARE_PROVIDER_SITE_OTHER): Payer: Medicare PPO | Admitting: Psychiatry

## 2023-07-17 ENCOUNTER — Encounter (HOSPITAL_COMMUNITY): Payer: Self-pay | Admitting: Psychiatry

## 2023-07-17 VITALS — BP 140/90 | HR 92 | Ht 67.0 in | Wt 132.0 lb

## 2023-07-17 DIAGNOSIS — F411 Generalized anxiety disorder: Secondary | ICD-10-CM | POA: Diagnosis not present

## 2023-07-17 DIAGNOSIS — F418 Other specified anxiety disorders: Secondary | ICD-10-CM | POA: Diagnosis not present

## 2023-07-17 DIAGNOSIS — F331 Major depressive disorder, recurrent, moderate: Secondary | ICD-10-CM

## 2023-07-17 MED ORDER — ESCITALOPRAM OXALATE 20 MG PO TABS: 20.0000 mg | ORAL_TABLET | Freq: Every day | ORAL | 1 refills | Status: DC

## 2023-07-17 MED ORDER — BUPROPION HCL ER (XL) 150 MG PO TB24: 150.0000 mg | ORAL_TABLET | Freq: Two times a day (BID) | ORAL | 1 refills | Status: DC

## 2023-07-17 MED ORDER — ESCITALOPRAM OXALATE 10 MG PO TABS: 10.0000 mg | ORAL_TABLET | Freq: Every day | ORAL | 1 refills | Status: DC

## 2023-07-17 NOTE — Progress Notes (Signed)
 [facility] Follow up visit   Patient Identification: Louna Rothgeb MRN:  [id number] Date of Evaluation:  07/17/2023 Referral Source: primary care Chief Complaint:   Chief Complaint  Patient presents with   Follow-up   Visit Diagnosis:    ICD-10-CM   1. Depression with anxiety  F41.8     2. MDD (major depressive disorder), recurrent episode, moderate (HCC)  F33.1     3. GAD (generalized anxiety disorder)  F41.1       History of Present Illness:  73 years old white widow, referred initially by PCP to eval for depression and manage. She is retired. Lives by herself, does like living by herself but noticing increase depression, anxiety First visit was very anxious and subdued, she has done very well for last couple of months , feels much different more energy and anxiety is manageable   Therapy is helping in general depression has improved   Husband would scream at her, she was always tense, now feel relief and recovering since he is not here   Denies psychosis or paranoia or hallucinations  Aggravating factors: widow, lives by herself Modifying factors friend, sister, church, pet  Duration more so last 3 yeas Severity improved Psych admission denies  Suicide attempt denies Past Psychiatric History: depression, anxiety  Previous Psychotropic Medications: Yes   Substance Abuse History in the last 12 months:  No.  Consequences of Substance Abuse: NA  Past Medical History:  Past Medical History:  Diagnosis Date   Depression    GAD (generalized anxiety disorder)    Heart murmur    History of colon polyps    Hypertension    Thyroid disease     Past Surgical History:  Procedure Laterality Date   CHOLECYSTECTOMY      Family Psychiatric History: denies. Mom had dementia  Family History:  Family History  Problem Relation Age of Onset   Dementia Mother    Hypertension Father    Cancer Father    Mesothelioma Father     Social History:   Social History   Socioeconomic  History   Marital status: Widowed    Spouse name: Not on file   Number of children: Not on file   Years of education: Not on file   Highest education level: Not on file  Occupational History   Not on file  Tobacco Use   Smoking status: Never    Passive exposure: Never   Smokeless tobacco: Never  Vaping Use   Vaping status: Never Used  Substance and Sexual Activity   Alcohol use: Not Currently   Drug use: Never   Sexual activity: Not on file  Other Topics Concern   Not on file  Social History Narrative   Not on file   Social Drivers of Health   Financial Resource Strain: Low Risk  (02/09/2023)   [facility]   Overall Financial Resource Strain (CARDIA)    Difficulty of Paying Living Expenses: Not hard at all  Food Insecurity: No Food Insecurity (02/09/2023)   [facility]   Hunger Vital Sign    Worried About Running Out of Food in the Last Year: Never true    Ran Out of Food in the Last Year: Never true  Transportation Needs: No Transportation Needs (02/09/2023)   Received from [facility] - Transportation    Lack of Transportation (Medical): No    Lack of Transportation (Non-Medical): No  Physical Activity: Insufficiently Active (02/28/2022)   [facility]  [facility] visits prior to 09/09/2022., [facility] visits prior to 09/09/[facility]22[facility]   Exercise Vital Sign    Days of Exercise per Week: 4 days    Minutes of Exercise per Session: 30 min  Stress: Stress Concern Present (02/28/2022)   [facility] from [facility] visits prior to 09/09/2022., [facility] visits prior to 09/09/2022[facility]   [employer] of Occupational Health - Occupational Stress Questionnaire    Feeling of Stress : To some extent  Social Connections: Moderately Integrated (02/28/2022)   [facility] from [facility] visits prior to 09/09/2022., [facility] visits prior to 09/09/2022., [facility]   Social Connection and Isolation Panel [NHANES]    Frequency of Communication with Friends and Family: More than three times a week    Frequency of Social Gatherings with Friends and Family: Three times a week    Attends Religious Services: More than 4 times per year    Active Member of Clubs or Organizations: Not on file    Attends Club or Organization Meetings: 1 to 4 times per year    Marital Status: Widowed      Allergies:   Allergies  Allergen Reactions   Risedronate Other (See Comments)    Joint pain Joint pain     Metabolic Disorder Labs: No results found for: HGBA1C, MPG No results found for: PROLACTIN No results found for: CHOL, TRIG, HDL, CHOLHDL, VLDL, LDLCALC No results found for: TSH  Therapeutic Level Labs: No results found for: LITHIUM No results found for: CBMZ No results found for: VALPROATE  Current Medications: Current Outpatient Medications  Medication Sig Dispense Refill   alendronate (FOSAMAX) 70 MG tablet Take 1 tablet in the morning weekly with a full glass of water on empty stomach     atorvastatin (LIPITOR) 10 MG tablet Take 1 tablet by mouth daily.     azithromycin  (ZITHROMAX ) 250 MG tablet Take 1 tablet (250 mg total) by mouth daily. Take first 2 tablets together, then 1 every day until finished. 6 tablet 0   brimonidine (ALPHAGAN) 0.2 % ophthalmic solution 1 drop 2 (two) times daily.     buPROPion  (WELLBUTRIN  XL) 150 MG 24 hr tablet Take by mouth.     Calcium Carbonate-Vitamin D 600-200 MG-UNIT TABS Take 1 tablet by mouth daily.     dorzolamide-timolol (COSOPT) 22.3-6.8 MG/ML ophthalmic solution Place 1 drop into both eyes 2 times daily.     escitalopram  (LEXAPRO ) 10 MG tablet Take 1 tablet (10 mg total) by mouth daily. take this in addition TO 20mg . total DOSE is 30mg . 30 tablet 1   escitalopram  (LEXAPRO ) 20 MG tablet Take 1 tablet by mouth daily.      losartan (COZAAR) 25 MG tablet      Multiple Vitamin (MULTI-VITAMIN) tablet Take 1 tablet by mouth daily.     Omega-3 Fatty Acids (FISH OIL) 1000 MG CAPS 1 capsule daily after lunch.     predniSONE  (STERAPRED UNI-PAK 21 TAB) 10 MG (21) TBPK tablet Take by mouth daily. Take 6 tabs by mouth daily  for 2 days, then 5 tabs for 2 days, then 4 tabs for 2 days, then 3 tabs for 2 days, 2 tabs for 2 days, then 1 tab by mouth daily for 2 days 42 tablet 0   vitamin B-12 (CYANOCOBALAMIN) 250 MCG tablet Take by mouth.     No current facility-administered medications  for this visit.     Psychiatric Specialty Exam: Review of Systems  Cardiovascular:  Negative for chest pain.  Psychiatric/Behavioral:  Negative for dysphoric mood and suicidal ideas.     Blood pressure (!) 140/90, pulse 92, height 5' 7 (1.702 m), weight 132 lb (59.9 kg).Body mass index is 20.67 kg/m.  General Appearance: Casual  Eye Contact:  Fair  Speech:  Slow  Volume:  Decreased  Mood: better  Affect:  constricted  Thought Process:  Goal Directed  Orientation:  Full (Time, Place, and Person)  Thought Content:  Rumination  Suicidal Thoughts:  No  Homicidal Thoughts:  No  Memory:  Immediate;   Fair  Judgement:  Fair  Insight:  Shallow  Psychomotor Activity:  Decreased  Concentration:  Concentration: Fair  Recall:  [employer] of Knowledge:Fair  Language: Fair  Akathisia:  No  Handed:    AIMS (if indicated):  not done  Assets:  Desire for Improvement Financial Resources/Insurance  ADL's:  Intact  Cognition: WNL, recall 2/3, some difficult multitasking  Sleep:  Fair   Screenings: GAD-7    Advertising Copywriter from 05/08/2023 in [facility] at [facility] Counselor from 04/10/2023 in [facility]  Total GAD-7 Score 1 0      PHQ2-9    Flowsheet Row Counselor from 05/08/2023 in [facility]  at [facility] Counselor from 04/10/2023 in [facility] at [facility] Counselor from 02/28/2023 in [facility] Out[facility] at [facility] Office Visit from 01/30/2023 in [facility] at [facility]  PHQ-2 Total Score 0 0 2 2  PHQ-9 Total Score 1 0 4 7      Flowsheet Row Office Visit from 04/17/2023 in [facility] [place] [facility] at [facility] Counselor from 02/28/2023 in [facility] at [facility] Office Visit from 01/30/2023 in [facility] Health at [facility]  C-SSRS RISK CATEGORY No Risk No Risk No Risk       Assessment and Plan: as follows  Prior documentation reviewed  MDD recurrent moderate:  improved, continue lexapro  20mg  plus 10mg  and wellbutrin  150mg  bid  GAd : better, sings at times, feel better continue lexapro   Continue therapy  Has cut down the energy drinks, is avoiding xanax as well since she is aware it will effect memory   Memory changes:improved, since anxiety improved and also not taking xanax. Less foggy  Fu 3 m.   Call for refills  Discussed regular exercises, reading books and engaging in activities.    Direct care time spent  20 minutes  including chart review, documentation  Collaboration of Care: Primary Care Provider AEB reviewed notes and reviewed  Patient/Guardian was advised Release of Information must be obtained prior to any record release in order to collaborate their care with an outside provider. Patient/Guardian was advised if they have not already done so to contact the registration department to sign all necessary forms in order for us  to release information regarding their care.   Consent: Patient/Guardian gives verbal consent for treatment and assignment of benefits for services provided during this visit. Patient/Guardian expressed  understanding and agreed to proceed.   Jackey Flight, MD 1/7/20252:33 PM

## 2023-08-20 ENCOUNTER — Ambulatory Visit (INDEPENDENT_AMBULATORY_CARE_PROVIDER_SITE_OTHER): Payer: Medicare PPO | Admitting: Licensed Clinical Social Worker

## 2023-08-20 DIAGNOSIS — F4323 Adjustment disorder with mixed anxiety and depressed mood: Secondary | ICD-10-CM | POA: Diagnosis not present

## 2023-08-20 NOTE — Progress Notes (Signed)
THERAPIST PROGRESS NOTE  Session Time:2:00 pm-2:45 pm  Type of Therapy: Individual Therapy  Purpose of session: Brandy Huffman will manage mood as evidenced by identifying and challenging negative and anxious thoughts, increase realistic and positive thinking, and cope with daily stressors for 5 out of 7 days for 60 days.  ProgressTowards Goals: Met  Interventions: Therapist utilized CBT, Solution focused brief therapy, and ACT to address anxious thoughts. Therapist administered PHQ9 and GAD7 to patient during session. Therapist explored patient's anxious thinking. Therapist worked with patient to identify steps to manage mood and anxiety.    Effectiveness: Patient was oriented x4 (person, place, situation, and time). Patient was casually dressed, and appropriately groomed. Patient was alert, engaged, pleasant, and cooperative. Patient completed PHQ9 and GAD7.  Patient noted that her mood has been a little down and her anxiety has been a little up. She feels like the winter and cold weather impact her mood. She worries about her dog. She doesn't want anything to happen to her or her dog that would separate them. Patient has been attending church, walking her dog on warm days, doing [place] chi, and going out to eat with friends several times a week. She feels like if she were doing these things her mood and anxiety would be worse. She feels like when the weather improves her mood will improve as well.   Patient engaged in session. She responded well to interventions. Patient continues to meet criteria for Adjustment disorder with anxiety and depressed mood. Patient will discontinue  outpatient therapy at this time. Patient met her goals.      08/20/2023    2:08 PM 05/08/2023    2:03 PM 04/10/2023    2:20 PM 03/01/2023   11:41 AM 01/30/2023   11:16 AM  Depression screen PHQ 2/9  Decreased Interest 0 0 0 1 1  Down, Depressed, Hopeless 0 0 0 1 1  PHQ - 2 Score 0 0 0 2 2  Altered sleeping  0 0 0   Tired,  decreased energy  0 0 0   Change in appetite  0 0 2   Feeling bad or failure about yourself   0 0 0   Trouble concentrating  1 0 0   Moving slowly or fidgety/restless  0 0 0   Suicidal thoughts  0 0 0   PHQ-9 Score  1 0 4   Difficult doing work/chores    Somewhat difficult          08/20/2023    2:08 PM 05/08/2023    2:03 PM 04/10/2023    2:21 PM  GAD 7 : Generalized Anxiety Score  Nervous, Anxious, on Edge 0 0 0  Control/stop worrying 1 1 0  Worry too much - different things 0 0 0  Trouble relaxing 0 0 0  Restless 0 0 0  Easily annoyed or irritable 0 0 0  Afraid - awful might happen 0 0 0  Total GAD 7 Score 1 1 0  Anxiety Difficulty Somewhat difficult Somewhat difficult Not difficult at all      Suicidal/Homicidal: Nowithout intent/plan  Plan: Return as needed.   Diagnosis: Adjustment disorder with mixed anxiety and depressed mood  Collaboration of Care: Psychiatrist AEB consult with Dr. Gilmore Laroche   Patient/Guardian was advised Release of Information must be obtained prior to any record release in order to collaborate their care with an outside provider. Patient/Guardian was advised if they have not already done so to contact the registration department to sign  all necessary forms in order for [place] to release information regarding their care.   Consent: Patient/Guardian gives verbal consent for treatment and assignment of benefits for services provided during this visit. Patient/Guardian expressed understanding and agreed to proceed.   Bynum Bellows, LCSW 08/20/2023

## 2023-09-05 ENCOUNTER — Other Ambulatory Visit (HOSPITAL_COMMUNITY): Payer: Self-pay | Admitting: Psychiatry

## 2023-09-17 ENCOUNTER — Ambulatory Visit (HOSPITAL_COMMUNITY): Payer: Medicare PPO | Admitting: Licensed Clinical Social Worker

## 2023-09-18 ENCOUNTER — Ambulatory Visit (INDEPENDENT_AMBULATORY_CARE_PROVIDER_SITE_OTHER): Payer: Medicare PPO | Admitting: Psychiatry

## 2023-09-18 ENCOUNTER — Encounter (HOSPITAL_COMMUNITY): Payer: Self-pay | Admitting: Psychiatry

## 2023-09-18 VITALS — BP 133/90 | HR 87 | Ht 67.0 in | Wt 131.0 lb

## 2023-09-18 DIAGNOSIS — F331 Major depressive disorder, recurrent, moderate: Secondary | ICD-10-CM | POA: Diagnosis not present

## 2023-09-18 DIAGNOSIS — F411 Generalized anxiety disorder: Secondary | ICD-10-CM

## 2023-09-18 MED ORDER — ESCITALOPRAM OXALATE 10 MG PO TABS: 10.0000 mg | ORAL_TABLET | Freq: Every day | ORAL | 1 refills | Status: DC

## 2023-09-18 MED ORDER — BUPROPION HCL ER (XL) 150 MG PO TB24: 150.0000 mg | ORAL_TABLET | Freq: Two times a day (BID) | ORAL | 1 refills | Status: DC

## 2023-09-18 MED ORDER — ESCITALOPRAM OXALATE 20 MG PO TABS: 20.0000 mg | ORAL_TABLET | Freq: Every day | ORAL | 1 refills | Status: DC

## 2023-09-18 NOTE — Progress Notes (Signed)
 [facility] Follow up visit   Patient Identification: Brandy Huffman MRN:  [id number] Date of Evaluation:  09/18/2023 Referral Source: primary care Chief Complaint:   Chief Complaint  Patient presents with   Follow-up   Visit Diagnosis:    ICD-10-CM   1. MDD (major depressive disorder), recurrent episode, moderate (HCC)  F33.1     2. GAD (generalized anxiety disorder)  F41.1       History of Present Illness:  73 years old white widow, referred initially by PCP to eval for depression and manage. She is retired. Lives by herself, does like living by herself but noticing increase depression, anxiety First visit was very anxious and subdued, she has done very well for last couple of months , feels much different more energy and anxiety is manageable  Patient is doing better compared to last year, less foggy since off xanax Husband would scream at her, she was always tense, now feel relief and recovering since he is not here  Energy and memory better since less anxious or depressed Denies psychosis or paranoia or hallucinations  Aggravating factors: widow, lives by herself Modifying factors friend, sister, church,pet  Duration more so last 3 yeas Severity improved Psych admission denies  Suicide attempt denies Past Psychiatric History: depression, anxiety  Previous Psychotropic Medications: Yes   Substance Abuse History in the last 12 months:  No.  Consequences of Substance Abuse: NA  Past Medical History:  Past Medical History:  Diagnosis Date   Depression    GAD (generalized anxiety disorder)    Heart murmur    History of colon polyps    Hypertension    Thyroid disease     Past Surgical History:  Procedure Laterality Date   CHOLECYSTECTOMY      Family Psychiatric History: denies. Mom had dementia  Family History:  Family History  Problem Relation Age of Onset   Dementia Mother    Hypertension Father    Cancer Father    Mesothelioma Father     Social History:    Social History   Socioeconomic History   Marital status: Widowed    Spouse name: Not on file   Number of children: Not on file   Years of education: Not on file   Highest education level: Not on file  Occupational History   Not on file  Tobacco Use   Smoking status: Never    Passive exposure: Never   Smokeless tobacco: Never  Vaping Use   Vaping status: Never Used  Substance and Sexual Activity   Alcohol use: Not Currently   Drug use: Never   Sexual activity: Not on file  Other Topics Concern   Not on file  Social History Narrative   Not on file   Social Drivers of Health   Financial Resource Strain: Low Risk  (02/09/2023)   [facility]   Overall Financial Resource Strain (CARDIA)    Difficulty of Paying Living Expenses: Not hard at all  Food Insecurity: No Food Insecurity (02/09/2023)   Received from [facility]   Hunger Vital Sign    Worried About Running Out of Food in the Last Year: Never true    Ran Out of Food in the Last Year: Never true  Transportation Needs: No Transportation Needs (02/09/2023)   [facility] from [facility] - Transportation    Lack of Transportation (Medical): No    Lack of Transportation (Non-Medical): No  Physical Activity: Insufficiently Active (02/28/2022)   [facility] visits prior to 09/09/2022., [facility] visits prior to 09/09/2022., [facility]   Exercise Vital Sign    Days of Exercise per Week: 4 days    Minutes of Exercise per Session: 30 min  Stress: Stress Concern Present (02/28/2022)   [facility] from [facility] visits prior to 09/09/2022., [facility] visits prior to 09/09/2022., [facility]   [employer] of Occupational Health - Occupational Stress Questionnaire    Feeling of Stress : To some extent  Social Connections: Moderately Integrated (02/28/2022)   Received from [facility] visits prior to 09/09/2022., [facility] visits prior to 09/09/2022., [facility]   Social Connection and Isolation Panel [NHANES]    Frequency of Communication with Friends and Family: More than three times a week    Frequency of Social Gatherings with Friends and Family: Three times a week    Attends Religious Services: More than 4 times per year    Active Member of Clubs or Organizations: Not on file    Attends Club or Organization Meetings: 1 to 4 times per year    Marital Status: Widowed      Allergies:   Allergies  Allergen Reactions   Risedronate Other (See Comments)    Joint pain Joint pain     Metabolic Disorder Labs: No results found for: "HGBA1C", "MPG" No results found for: "PROLACTIN" No results found for: "CHOL", "TRIG", "HDL", "CHOLHDL", "VLDL", "LDLCALC" No results found for: "TSH"  Therapeutic Level Labs: No results found for: "LITHIUM" No results found for: "CBMZ" No results found for: "VALPROATE"  Current Medications: Current Outpatient Medications  Medication Sig Dispense Refill   alendronate (FOSAMAX) 70 MG tablet Take 1 tablet in the morning weekly with a full glass of water on empty stomach     atorvastatin (LIPITOR) 10 MG tablet Take 1 tablet by mouth daily.     azithromycin (ZITHROMAX) 250 MG tablet Take 1 tablet (250 mg total) by mouth daily. Take first 2 tablets together, then 1 every day until finished. 6 tablet 0   brimonidine (ALPHAGAN) 0.2 % ophthalmic solution 1 drop 2 (two) times daily.     Calcium Carbonate-Vitamin D 600-200 MG-UNIT TABS Take 1 tablet by mouth daily.     dorzolamide-timolol (COSOPT) 22.3-6.8 MG/ML ophthalmic solution Place 1 drop into both eyes 2 times daily.     losartan (COZAAR) 25 MG tablet      Multiple Vitamin (MULTI-VITAMIN) tablet Take 1 tablet by mouth daily.     Omega-3 Fatty Acids (FISH OIL) 1000 MG CAPS 1 capsule daily after lunch.     predniSONE (STERAPRED UNI-PAK 21 TAB) 10 MG (21)  TBPK tablet Take by mouth daily. Take 6 tabs by mouth daily  for 2 days, then 5 tabs for 2 days, then 4 tabs for 2 days, then 3 tabs for 2 days, 2 tabs for 2 days, then 1 tab by mouth daily for 2 days 42 tablet 0   vitamin B-12 (CYANOCOBALAMIN) 250 MCG tablet Take by mouth.     buPROPion (WELLBUTRIN XL) 150 MG 24 hr tablet Take 1 tablet (150 mg total) by mouth 2 (two) times daily. 60 tablet 1   escitalopram (LEXAPRO) 10 MG tablet Take 1 tablet (10 mg total) by mouth daily. 30 tablet 1   escitalopram (LEXAPRO) 20 MG tablet Take 1 tablet (20 mg total) by mouth daily. 30 tablet 1   No  current facility-administered medications for this visit.     Psychiatric Specialty Exam: Review of Systems  Cardiovascular:  Negative for chest pain.  Psychiatric/Behavioral:  Negative for dysphoric mood and suicidal ideas.     Blood pressure (!) 133/90, pulse 87, height 5\' 7"  (1.702 m), weight 131 lb (59.4 kg).Body mass index is 20.52 kg/m.  General Appearance: Casual  Eye Contact:  Fair  Speech:  Slow  Volume:  Decreased  Mood: better  Affect:  constricted  Thought Process:  Goal Directed  Orientation:  Full (Time, Place, and Person)  Thought Content:  Rumination  Suicidal Thoughts:  No  Homicidal Thoughts:  No  Memory:  Immediate;   Fair  Judgement:  Fair  Insight:  Shallow  Psychomotor Activity:  Decreased  Concentration:  Concentration: Fair  Recall:  [employer] of Knowledge:Fair  Language: Fair  Akathisia:  No  Handed:    AIMS (if indicated):  not done  Assets:  Desire for Improvement Financial Resources/Insurance  ADL's:  Intact  Cognition: WNL, recall 2/3, some difficult multitasking  Sleep:  Fair   Screenings: GAD-7    Advertising copywriter from 08/20/2023 in [facility] at [facility] Counselor from 05/08/2023 in [facility] at [facility] Counselor from 04/10/2023 in [facility]  Health at [facility]  Total GAD-7 Score 1 1 0      PHQ2-9    Flowsheet Row Counselor from 08/20/2023 in [facility] at [facility] Counselor from 05/08/2023 in [facility] Out[facility] at [facility] Counselor from 04/10/2023 in [facility] Outpatient Behavioral Health at [facility] Counselor from 02/28/2023 in [facility] Out[facility] Behavioral Health at [facility] Office Visit from 01/30/2023 in [facility] Outpatient Behavioral Health at [facility]  PHQ-2 Total Score 0 0 0 2 2  PHQ-9 Total Score -- 1 0 4 7      Flowsheet Row Office Visit from 04/17/2023 in [facility] at [facility] Counselor from 02/28/2023 in [facility] at [facility] Office Visit from 01/30/2023 in [facility] Outpatient Behavioral Health at [facility]  C-SSRS RISK CATEGORY No Risk No Risk No Risk       Assessment and Plan: as follows  Prior documentation reviewed  MDD recurrent moderate: improved continue lexapro 20 plus 10. Wellbutrin bid  GAd" : better continue meds, does [place] Chi as well it helps, continue therapy  Memory changes:improved, since anxiety improved and also not taking xanax. Less foggy  Fu 3 m.   Call for refills  Discussed regular exercises, reading books and engaging in activities.    Direct care time spent 18 minutes  including chart review, documentation  Collaboration of Care: Primary Care Provider AEB reviewed notes and reviewed  Patient/Guardian was advised Release of Information must be obtained prior to any record release in order to collaborate their care with an outside provider. Patient/Guardian was advised if they have not already done so to contact the registration department to sign all necessary forms in order for [place] to release information regarding their care.   Consent:  Patient/Guardian gives verbal consent for treatment and assignment of benefits for services provided during this visit. Patient/Guardian expressed understanding and agreed to proceed.   Thresa Ross, MD 3/11/20252:36 PM

## 2023-11-20 ENCOUNTER — Ambulatory Visit (INDEPENDENT_AMBULATORY_CARE_PROVIDER_SITE_OTHER): Payer: Medicare PPO | Admitting: Licensed Clinical Social Worker

## 2023-11-20 DIAGNOSIS — F4323 Adjustment disorder with mixed anxiety and depressed mood: Secondary | ICD-10-CM | POA: Diagnosis not present

## 2023-11-20 NOTE — Progress Notes (Signed)
 THERAPIST PROGRESS NOTE  Session Time: 2:00 pm-2:50 pm  Type of Therapy: Individual Therapy  Purpose of session: Meldoy will manage mood as evidenced by identifying and challenging negative and anxious thoughts, increase realistic and positive thinking, and cope with daily stressors for 5 out of 7 days for 60 days.  ProgressTowards Goals: Met  Interventions: Therapist utilized CBT, Solution focused brief therapy, and ACT to address anxious thoughts. Therapist worked with patient on coping with memory loss and stressors.   Effectiveness: Patient was oriented x4 (person, place, situation, and time). Patient was casually dressed, and appropriately groomed. Patient was alert, engaged, pleasant, and cooperative. Patient noted that she continues to wake up nauseous. She gets tearful in the morning. She feels like it is more from frustration than depression. Patient continues to misplace her keys and her phone which frustrates her because she is trying to leave the home. Patient has a place where she is supposed to put her phone and her keys but sometimes forgets to put them there. Patient agreed to set an alarm on her phone as a reminder or use a visual with sticky notes to remind herself to put her phone up. Patient has a red spot on her leg that she is getting removed. She was told it was cancer. Patient has been going to church and socializing with friends. She has been walking her dog more and going to tai chi. Patient understood that movement/exercise can help with cognition/mental health. Patient has been referred to neruopsychology in July by her neruologist.   Patient engaged in session. She responded well to interventions. Patient continues to meet criteria for Adjustment disorder with anxiety and depressed mood. Patient will discontinue  outpatient therapy at this time. Patient met her goals.      08/20/2023    2:08 PM 05/08/2023    2:03 PM 04/10/2023    2:20 PM 03/01/2023   11:41 AM  01/30/2023   11:16 AM  Depression screen PHQ 2/9  Decreased Interest 0 0 0 1 1  Down, Depressed, Hopeless 0 0 0 1 1  PHQ - 2 Score 0 0 0 2 2  Altered sleeping  0 0 0   Tired, decreased energy  0 0 0   Change in appetite  0 0 2   Feeling bad or failure about yourself   0 0 0   Trouble concentrating  1 0 0   Moving slowly or fidgety/restless  0 0 0   Suicidal thoughts  0 0 0   PHQ-9 Score  1 0 4   Difficult doing work/chores    Somewhat difficult          08/20/2023    2:08 PM 05/08/2023    2:03 PM 04/10/2023    2:21 PM  GAD 7 : Generalized Anxiety Score  Nervous, Anxious, on Edge 0 0 0  Control/stop worrying 1 1 0  Worry too much - different things 0 0 0  Trouble relaxing 0 0 0  Restless 0 0 0  Easily annoyed or irritable 0 0 0  Afraid - awful might happen 0 0 0  Total GAD 7 Score 1 1 0  Anxiety Difficulty Somewhat difficult Somewhat difficult Not difficult at all      Suicidal/Homicidal: Nowithout intent/plan  Plan: Return as needed.   Diagnosis: Adjustment disorder with mixed anxiety and depressed mood  Collaboration of Care: Psychiatrist AEB consult with Dr. Aram Knights   Patient/Guardian was advised Release of Information must be obtained prior  to any record release in order to collaborate their care with an outside provider. Patient/Guardian was advised if they have not already done so to contact the registration department to sign all necessary forms in order for us  to release information regarding their care.   Consent: Patient/Guardian gives verbal consent for treatment and assignment of benefits for services provided during this visit. Patient/Guardian expressed understanding and agreed to proceed.   Braxton Calico, LCSW 11/20/2023

## 2023-11-27 ENCOUNTER — Other Ambulatory Visit (HOSPITAL_COMMUNITY): Payer: Self-pay | Admitting: Psychiatry

## 2023-12-26 ENCOUNTER — Other Ambulatory Visit (HOSPITAL_COMMUNITY): Payer: Self-pay | Admitting: Psychiatry

## 2024-01-01 ENCOUNTER — Ambulatory Visit (INDEPENDENT_AMBULATORY_CARE_PROVIDER_SITE_OTHER): Admitting: Licensed Clinical Social Worker

## 2024-01-01 DIAGNOSIS — F4323 Adjustment disorder with mixed anxiety and depressed mood: Secondary | ICD-10-CM

## 2024-01-01 NOTE — Progress Notes (Unsigned)
 THERAPIST PROGRESS NOTE  Session Time: 2:05 pm-2:45 pm  Type of Therapy: Individual Therapy  Purpose of session: Shakora will manage mood as evidenced by identifying and challenging negative and anxious thoughts, increase realistic and positive thinking, and cope with daily stressors for 5 out of 7 days for 60 days.  ProgressTowards Goals: Met  Interventions: Therapist utilized CBT, Solution focused brief therapy, and ACT to address anxious thoughts. Therapist worked with patient on increasing realistic and positive thinking.   Effectiveness: Patient was oriented x4 (person, place, situation, and time). Patient was casually dressed, and appropriately groomed. Patient was alert, engaged, pleasant, and cooperative. Patient is feeling better but still has moments in the morning where she feels sick and will cry due to feeling sick. She feels like she is doing better cognitively. Her sister also has noticed an improvement. Patient understood that staying hydrated can help with mood and cognition especially during the summer due to the impact of dehydration on mood as well as brain function. She is still going to church and Tai Chi. She is spending time with friends, and she is eating regularly.   Patient engaged in session. She responded well to interventions. Patient continues to meet criteria for Adjustment disorder with anxiety and depressed mood. Patient will discontinue  outpatient therapy at this time. Patient met her goals.      08/20/2023    2:08 PM 05/08/2023    2:03 PM 04/10/2023    2:20 PM 03/01/2023   11:41 AM 01/30/2023   11:16 AM  Depression screen PHQ 2/9  Decreased Interest 0 0 0 1 1  Down, Depressed, Hopeless 0 0 0 1 1  PHQ - 2 Score 0 0 0 2 2  Altered sleeping  0 0 0   Tired, decreased energy  0 0 0   Change in appetite  0 0 2   Feeling bad or failure about yourself   0 0 0   Trouble concentrating  1 0 0   Moving slowly or fidgety/restless  0 0 0   Suicidal thoughts  0 0 0    PHQ-9 Score  1 0 4   Difficult doing work/chores    Somewhat difficult          08/20/2023    2:08 PM 05/08/2023    2:03 PM 04/10/2023    2:21 PM  GAD 7 : Generalized Anxiety Score  Nervous, Anxious, on Edge 0 0 0  Control/stop worrying 1 1 0  Worry too much - different things 0 0 0  Trouble relaxing 0 0 0  Restless 0 0 0  Easily annoyed or irritable 0 0 0  Afraid - awful might happen 0 0 0  Total GAD 7 Score 1 1 0  Anxiety Difficulty Somewhat difficult Somewhat difficult Not difficult at all      Suicidal/Homicidal: Nowithout intent/plan  Plan: Return as needed.   Diagnosis: Adjustment disorder with mixed anxiety and depressed mood  Collaboration of Care: Psychiatrist AEB consult with Dr. Geralene   Patient/Guardian was advised Release of Information must be obtained prior to any record release in order to collaborate their care with an outside provider. Patient/Guardian was advised if they have not already done so to contact the registration department to sign all necessary forms in order for us  to release information regarding their care.   Consent: Patient/Guardian gives verbal consent for treatment and assignment of benefits for services provided during this visit. Patient/Guardian expressed understanding and agreed to proceed.  Fonda Conroy, LCSW 01/01/2024

## 2024-01-15 ENCOUNTER — Ambulatory Visit (HOSPITAL_COMMUNITY): Admitting: Psychiatry

## 2024-01-22 ENCOUNTER — Ambulatory Visit (HOSPITAL_COMMUNITY): Admitting: Psychiatry

## 2024-01-23 ENCOUNTER — Other Ambulatory Visit (HOSPITAL_COMMUNITY): Payer: Self-pay | Admitting: Psychiatry

## 2024-01-29 ENCOUNTER — Encounter (HOSPITAL_COMMUNITY): Payer: Self-pay | Admitting: Psychiatry

## 2024-01-29 ENCOUNTER — Ambulatory Visit (INDEPENDENT_AMBULATORY_CARE_PROVIDER_SITE_OTHER): Admitting: Psychiatry

## 2024-01-29 VITALS — BP 138/82 | HR 99 | Temp 98.7°F | Resp 98 | Ht 65.5 in | Wt 134.0 lb

## 2024-01-29 DIAGNOSIS — F411 Generalized anxiety disorder: Secondary | ICD-10-CM

## 2024-01-29 DIAGNOSIS — F331 Major depressive disorder, recurrent, moderate: Secondary | ICD-10-CM | POA: Diagnosis not present

## 2024-01-29 MED ORDER — ESCITALOPRAM OXALATE 20 MG PO TABS: 20.0000 mg | ORAL_TABLET | Freq: Every day | ORAL | 2 refills | Status: DC

## 2024-01-29 MED ORDER — BUPROPION HCL ER (XL) 150 MG PO TB24: 150.0000 mg | ORAL_TABLET | Freq: Two times a day (BID) | ORAL | 2 refills | Status: DC

## 2024-01-29 MED ORDER — ESCITALOPRAM OXALATE 10 MG PO TABS: 10.0000 mg | ORAL_TABLET | Freq: Every day | ORAL | 2 refills | Status: DC

## 2024-01-29 NOTE — Progress Notes (Signed)
 [facility] Follow up visit   Patient Identification: Brandy Huffman MRN:  [id number] Date of Evaluation:  01/29/2024 Referral Source: primary care Chief Complaint:   No chief complaint on file.  Visit Diagnosis:    ICD-10-CM   1. MDD (major depressive disorder), recurrent episode, moderate (HCC)  F33.1     2. GAD (generalized anxiety disorder)  F41.1       History of Present Illness:  73 years old white widow, referred initially by PCP to eval for depression and manage. She is retired. Lives by herself, does like living by herself but noticing increase depression, anxiety On eval doing fair, has friends likes to get together and that helps anxiety Reading helps as well   Aggravating factors: widow, lives by herself Modifying factors friend, sister, church, pet  Duration more so last 3 yeas Severity improved Psych admission denies  Suicide attempt denies Past Psychiatric History: depression, anxiety  Previous Psychotropic Medications: Yes   Substance Abuse History in the last 12 months:  No.  Consequences of Substance Abuse: NA  Past Medical History:  Past Medical History:  Diagnosis Date   Depression    GAD (generalized anxiety disorder)    Heart murmur    History of colon polyps    Hypertension    Thyroid disease     Past Surgical History:  Procedure Laterality Date   CHOLECYSTECTOMY      Family Psychiatric History: denies. Mom had dementia  Family History:  Family History  Problem Relation Age of Onset   Dementia Mother    Hypertension Father    Cancer Father    Mesothelioma Father     Social History:   Social History   Socioeconomic History   Marital status: Widowed    Spouse name: Not on file   Number of children: Not on file   Years of education: Not on file   Highest education level: Not on file  Occupational History   Not on file  Tobacco Use   Smoking status: Never    Passive exposure: Never   Smokeless tobacco: Never  Vaping Use   Vaping  status: Never Used  Substance and Sexual Activity   Alcohol use: Not Currently    Comment: glass of wine monthly   Drug use: Never   Sexual activity: Not on file  Other Topics Concern   Not on file  Social History Narrative   Not on file   Social Drivers of Health   Financial Resource Strain: Low Risk  (11/05/2023)   Received from [facility]   Overall Financial Resource Strain (CARDIA)    Difficulty of Paying Living Expenses: Not very hard  Food Insecurity: No Food Insecurity (11/05/2023)   Received from [facility]   Hunger Vital Sign    Within the past 12 months, you worried that your food would run out before you got the money to buy more.: Never true    Within the past 12 months, the food you bought just didn't last and you didn't have money to get more.: Never true  Transportation Needs: No Transportation Needs (11/05/2023)   Received from [facility] - Transportation    Lack of Transportation (Medical): No    Lack of Transportation (Non-Medical): No  Physical Activity: Insufficiently Active (02/28/2022)   [facility] from [facility] visits prior to [facility]/09/2022[facility]   Exercise Vital Sign    On average, how many days per week do you engage in moderate to  strenuous exercise (like a brisk walk)?: 4 days    On average, how many minutes do you engage in exercise at this level?: 30 min  Stress: Stress Concern Present (02/28/2022)   Received from [facility] visits prior to 09/09/2022., [facility]   [employer] of Occupational Health - Occupational Stress Questionnaire    Feeling of Stress : To some extent  Social Connections: Moderately Integrated (02/28/2022)   [facility] visits prior to [facility]/09/[facility]22[facility]   Social Connection and Isolation Panel    In a typical week, how many times do you talk on the phone with family, friends, or neighbors?: More than three times a  week    How often do you get together with friends or relatives?: Three times a week    How often do you attend church or religious services?: More than 4 times per year    Active Member of Clubs or Organizations: Not on file    How often do you attend meetings of the clubs or organizations you belong to?: 1 to 4 times per year    Are you married, widowed, divorced, separated, never married, or living with a partner?: Widowed      Allergies:   Allergies  Allergen Reactions   Risedronate Other (See Comments)    Joint pain Joint pain     Metabolic Disorder Labs: No results found for: HGBA1C, MPG No results found for: PROLACTIN No results found for: CHOL, TRIG, HDL, CHOLHDL, VLDL, LDLCALC No results found for: TSH  Therapeutic Level Labs: No results found for: LITHIUM No results found for: CBMZ No results found for: VALPROATE  Current Medications: Current Outpatient Medications  Medication Sig Dispense Refill   alendronate (FOSAMAX) 70 MG tablet Take 1 tablet in the morning weekly with a full glass of water on empty stomach     atorvastatin (LIPITOR) 10 MG tablet Take 1 tablet by mouth daily.     azithromycin  (ZITHROMAX ) 250 MG tablet Take 1 tablet (250 mg total) by mouth daily. Take first 2 tablets together, then 1 every day until finished. 6 tablet 0   brimonidine (ALPHAGAN) 0.2 % ophthalmic solution 1 drop 2 (two) times daily.     buPROPion  (WELLBUTRIN  XL) 150 MG 24 hr tablet Take 1 tablet (150 mg total) by mouth 2 (two) times daily. 60 tablet 0   Calcium Carbonate-Vitamin D 600-200 MG-UNIT TABS Take 1 tablet by mouth daily.     dorzolamide-timolol (COSOPT) 22.3-6.8 MG/ML ophthalmic solution Place 1 drop into both eyes 2 times daily.     escitalopram  (LEXAPRO ) 10 MG tablet Take 1 tablet (10 mg total) by mouth daily. 30 tablet 1   escitalopram  (LEXAPRO ) 20 MG tablet Take 1 tablet (20 mg total) by mouth daily. 30 tablet 0   losartan (COZAAR) 25 MG  tablet      Multiple Vitamin (MULTI-VITAMIN) tablet Take 1 tablet by mouth daily.     Omega-3 Fatty Acids (FISH OIL) 1000 MG CAPS 1 capsule daily after lunch.     vitamin B-12 (CYANOCOBALAMIN) 250 MCG tablet Take by mouth.     predniSONE  (STERAPRED UNI-PAK 21 TAB) 10 MG (21) TBPK tablet Take by mouth daily. Take 6 tabs by mouth daily  for 2 days, then 5 tabs for 2 days, then 4 tabs for 2 days, then 3 tabs for 2 days, 2 tabs for 2 days, then 1 tab by mouth daily for 2 days (Patient not taking: Reported  on 01/29/2024) 42 tablet 0   No current facility-administered medications for this visit.     Psychiatric Specialty Exam: Review of Systems  Cardiovascular:  Negative for chest pain.  Psychiatric/Behavioral:  Negative for dysphoric mood and suicidal ideas.     Blood pressure 138/82, pulse 99, temperature 98.7 F (37.1 C), temperature source Temporal, resp. rate (!) 98, height 5' 5.5 (1.664 m), weight 134 lb (60.8 kg).Body mass index is 21.96 kg/m.  General Appearance: Casual  Eye Contact:  Fair  Speech:  Slow  Volume:  Decreased  Mood: better  Affect:  constricted  Thought Process:  Goal Directed  Orientation:  Full (Time, Place, and Person)  Thought Content:  Rumination  Suicidal Thoughts:  No  Homicidal Thoughts:  No  Memory:  Immediate;   Fair  Judgement:  Fair  Insight:  Shallow  Psychomotor Activity:  Decreased  Concentration:  Concentration: Fair  Recall:  [employer] of Knowledge:Fair  Language: Fair  Akathisia:  No  Handed:    AIMS (if indicated):  not done  Assets:  Desire for Improvement Financial Resources/Insurance  ADL's:  Intact  Cognition: WNL, recall 2/3, some difficult multitasking  Sleep:  Fair   Screenings: GAD-7    Advertising copywriter from 08/20/2023 in [facility] at [facility] Counselor from 05/08/2023 in [facility] at [facility] Counselor from 04/10/2023 in [facility] Outpatient Behavioral Health at [facility]  Total GAD-7 Score 1 1 0   PHQ2-9    Flowsheet Row Counselor from 08/20/2023 in [facility] at [facility] Counselor from 05/08/2023 in [facility] at [facility] Counselor from 04/10/2023 in [facility]al Health at [facility] Counselor from 02/28/2023 in [facility] Outpatient Behavior[facility] at [facility] Office Visit from 01/30/2023 in [facility] at [facility]  PHQ-2 Total Score 0 0 0 2 2  PHQ-9 Total Score -- 1 0 4 7   Flowsheet Row Office Visit from 09/18/2023 in [facility] at [facility] Office Visit from 04/17/2023 in [facility] Outpatient Behavioral Health at [facility] Counselor from 02/28/2023 in [facility] Outpatient Behavioral [facility] at [facility]  C-SSRS RISK CATEGORY No Risk No Risk No Risk    Assessment and Plan: as follows  Prior documentation reviewed  MDD recurrent moderate: doing stable continue wellbutrin , lexapro  20 plus 10 mg   GAd : better continue lexapro  also goes to [place] [employer] classes [facility]  Memory changes:improved, since anxiety improved and also not taking xanax. Less foggy  Fu 3 - 4 m   Call for refills  Discussed regular exercises, reading books and engaging in activities.    Direct care time spent 18 minutes  including chart review, documentation  Collaboration of Care: Primary Care Provider AEB reviewed notes and reviewed  Patient/Guardian was advised Release of Information must be obtained prior to any record release in order to collaborate their care with an outside provider. Patient/Guardian was advised if they have not already done so to contact the registration department to sign all necessary forms in order for us  to release information regarding their care.    Consent: Patient/Guardian gives verbal consent for treatment and assignment of benefits for services provided during this visit. Patient/Guardian expressed understanding and agreed to proceed.   Jackey Flight, MD 7/22/20251:08 PM

## 2024-04-29 ENCOUNTER — Other Ambulatory Visit (HOSPITAL_COMMUNITY): Payer: Self-pay | Admitting: Psychiatry

## 2024-05-28 ENCOUNTER — Telehealth (HOSPITAL_COMMUNITY): Payer: Self-pay | Admitting: Psychiatry

## 2024-05-28 NOTE — Telephone Encounter (Signed)
 Patient's sister Slater Narrow returned call regarding rescheduling 06/03/2024 appointment and stated that she has established patient with new providers in the [facility] system to consolidate care. Expressed her appreciation for patient's care by both med management and therapy providers in this clinic.

## 2024-06-03 ENCOUNTER — Ambulatory Visit (HOSPITAL_COMMUNITY): Admitting: Psychiatry

## 2024-06-03 ENCOUNTER — Encounter (HOSPITAL_COMMUNITY): Payer: Self-pay | Admitting: Psychiatry

## 2024-06-22 ENCOUNTER — Other Ambulatory Visit (HOSPITAL_COMMUNITY): Payer: Self-pay | Admitting: Psychiatry

## 2024-07-20 ENCOUNTER — Other Ambulatory Visit (HOSPITAL_COMMUNITY): Payer: Self-pay | Admitting: Psychiatry
